# Patient Record
Sex: Female | Born: 1970 | Race: Black or African American | Hispanic: No | Marital: Single | Smoking: Never smoker
Health system: Southern US, Community
[De-identification: ages and names within clinical notes are randomized; demographics above are authoritative.]

## PROBLEM LIST (undated history)

## (undated) DIAGNOSIS — F419 Anxiety disorder, unspecified: Secondary | ICD-10-CM

## (undated) DIAGNOSIS — A6000 Herpesviral infection of urogenital system, unspecified: Secondary | ICD-10-CM

## (undated) DIAGNOSIS — F32A Depression, unspecified: Secondary | ICD-10-CM

## (undated) DIAGNOSIS — G43909 Migraine, unspecified, not intractable, without status migrainosus: Secondary | ICD-10-CM

## (undated) DIAGNOSIS — T7840XA Allergy, unspecified, initial encounter: Secondary | ICD-10-CM

## (undated) DIAGNOSIS — E785 Hyperlipidemia, unspecified: Secondary | ICD-10-CM

## (undated) DIAGNOSIS — F988 Other specified behavioral and emotional disorders with onset usually occurring in childhood and adolescence: Secondary | ICD-10-CM

## (undated) DIAGNOSIS — IMO0002 Reserved for concepts with insufficient information to code with codable children: Secondary | ICD-10-CM

## (undated) DIAGNOSIS — F329 Major depressive disorder, single episode, unspecified: Secondary | ICD-10-CM

## (undated) HISTORY — DX: Herpesviral infection of urogenital system, unspecified: A60.00

## (undated) HISTORY — PX: EYE SURGERY: SHX253

## (undated) HISTORY — DX: Allergy, unspecified, initial encounter: T78.40XA

## (undated) HISTORY — DX: Migraine, unspecified, not intractable, without status migrainosus: G43.909

## (undated) HISTORY — DX: Reserved for concepts with insufficient information to code with codable children: IMO0002

## (undated) HISTORY — DX: Hyperlipidemia, unspecified: E78.5

---

## 1997-05-01 ENCOUNTER — Encounter (HOSPITAL_COMMUNITY): Admission: RE | Admit: 1997-05-01 | Discharge: 1997-07-30 | Payer: Self-pay | Admitting: Gynecology

## 1997-10-23 ENCOUNTER — Emergency Department (HOSPITAL_COMMUNITY): Admission: EM | Admit: 1997-10-23 | Discharge: 1997-10-23 | Payer: Self-pay | Admitting: Emergency Medicine

## 1999-06-01 ENCOUNTER — Inpatient Hospital Stay (HOSPITAL_COMMUNITY): Admission: AD | Admit: 1999-06-01 | Discharge: 1999-06-01 | Payer: Self-pay | Admitting: Gynecology

## 1999-06-01 ENCOUNTER — Encounter: Payer: Self-pay | Admitting: Gynecology

## 1999-12-05 ENCOUNTER — Inpatient Hospital Stay (HOSPITAL_COMMUNITY): Admission: AD | Admit: 1999-12-05 | Discharge: 1999-12-05 | Payer: Self-pay | Admitting: *Deleted

## 1999-12-05 ENCOUNTER — Encounter: Payer: Self-pay | Admitting: *Deleted

## 1999-12-16 ENCOUNTER — Encounter (INDEPENDENT_AMBULATORY_CARE_PROVIDER_SITE_OTHER): Payer: Self-pay | Admitting: Specialist

## 1999-12-16 ENCOUNTER — Ambulatory Visit (HOSPITAL_COMMUNITY): Admission: RE | Admit: 1999-12-16 | Discharge: 1999-12-16 | Payer: Self-pay | Admitting: *Deleted

## 1999-12-16 ENCOUNTER — Encounter: Payer: Self-pay | Admitting: *Deleted

## 1999-12-16 ENCOUNTER — Inpatient Hospital Stay (HOSPITAL_COMMUNITY): Admission: AD | Admit: 1999-12-16 | Discharge: 1999-12-19 | Payer: Self-pay | Admitting: Gynecology

## 1999-12-20 ENCOUNTER — Encounter: Admission: RE | Admit: 1999-12-20 | Discharge: 2000-03-19 | Payer: Self-pay | Admitting: Gynecology

## 2000-03-20 ENCOUNTER — Encounter: Admission: RE | Admit: 2000-03-20 | Discharge: 2000-06-18 | Payer: Self-pay | Admitting: Gynecology

## 2000-07-19 ENCOUNTER — Encounter: Admission: RE | Admit: 2000-07-19 | Discharge: 2000-08-18 | Payer: Self-pay | Admitting: Gynecology

## 2000-09-18 ENCOUNTER — Encounter: Admission: RE | Admit: 2000-09-18 | Discharge: 2000-10-18 | Payer: Self-pay | Admitting: Gynecology

## 2001-12-31 ENCOUNTER — Other Ambulatory Visit: Admission: RE | Admit: 2001-12-31 | Discharge: 2001-12-31 | Payer: Self-pay | Admitting: Gynecology

## 2003-03-08 ENCOUNTER — Other Ambulatory Visit: Admission: RE | Admit: 2003-03-08 | Discharge: 2003-03-08 | Payer: Self-pay | Admitting: Gynecology

## 2005-03-26 ENCOUNTER — Other Ambulatory Visit: Admission: RE | Admit: 2005-03-26 | Discharge: 2005-03-26 | Payer: Self-pay | Admitting: Gynecology

## 2005-07-31 ENCOUNTER — Emergency Department (HOSPITAL_COMMUNITY): Admission: EM | Admit: 2005-07-31 | Discharge: 2005-07-31 | Payer: Self-pay | Admitting: Emergency Medicine

## 2005-08-15 ENCOUNTER — Emergency Department (HOSPITAL_COMMUNITY): Admission: EM | Admit: 2005-08-15 | Discharge: 2005-08-15 | Payer: Self-pay | Admitting: Emergency Medicine

## 2006-03-31 DIAGNOSIS — R87619 Unspecified abnormal cytological findings in specimens from cervix uteri: Secondary | ICD-10-CM

## 2006-03-31 DIAGNOSIS — IMO0002 Reserved for concepts with insufficient information to code with codable children: Secondary | ICD-10-CM

## 2006-03-31 HISTORY — DX: Unspecified abnormal cytological findings in specimens from cervix uteri: R87.619

## 2006-03-31 HISTORY — DX: Reserved for concepts with insufficient information to code with codable children: IMO0002

## 2007-11-26 ENCOUNTER — Emergency Department (HOSPITAL_BASED_OUTPATIENT_CLINIC_OR_DEPARTMENT_OTHER): Admission: EM | Admit: 2007-11-26 | Discharge: 2007-11-26 | Payer: Self-pay | Admitting: Emergency Medicine

## 2008-04-05 ENCOUNTER — Emergency Department (HOSPITAL_BASED_OUTPATIENT_CLINIC_OR_DEPARTMENT_OTHER): Admission: EM | Admit: 2008-04-05 | Discharge: 2008-04-06 | Payer: Self-pay | Admitting: Emergency Medicine

## 2008-04-06 ENCOUNTER — Ambulatory Visit: Payer: Self-pay | Admitting: Diagnostic Radiology

## 2008-04-13 ENCOUNTER — Emergency Department (HOSPITAL_BASED_OUTPATIENT_CLINIC_OR_DEPARTMENT_OTHER): Admission: EM | Admit: 2008-04-13 | Discharge: 2008-04-13 | Payer: Self-pay | Admitting: Emergency Medicine

## 2008-04-13 ENCOUNTER — Ambulatory Visit: Payer: Self-pay | Admitting: Diagnostic Radiology

## 2009-02-25 ENCOUNTER — Emergency Department (HOSPITAL_BASED_OUTPATIENT_CLINIC_OR_DEPARTMENT_OTHER): Admission: EM | Admit: 2009-02-25 | Discharge: 2009-02-25 | Payer: Self-pay | Admitting: Emergency Medicine

## 2009-08-16 ENCOUNTER — Ambulatory Visit: Payer: Self-pay | Admitting: Family Medicine

## 2009-08-16 DIAGNOSIS — J45909 Unspecified asthma, uncomplicated: Secondary | ICD-10-CM

## 2009-08-16 LAB — CONVERTED CEMR LAB: Beta hcg, urine, semiquantitative: POSITIVE

## 2009-08-24 ENCOUNTER — Inpatient Hospital Stay (HOSPITAL_COMMUNITY): Admission: AD | Admit: 2009-08-24 | Discharge: 2009-08-24 | Payer: Self-pay | Admitting: Obstetrics & Gynecology

## 2009-08-24 ENCOUNTER — Ambulatory Visit: Payer: Self-pay | Admitting: Advanced Practice Midwife

## 2009-08-30 ENCOUNTER — Ambulatory Visit: Payer: Self-pay | Admitting: Obstetrics & Gynecology

## 2009-08-30 ENCOUNTER — Encounter: Payer: Self-pay | Admitting: Physician Assistant

## 2009-08-30 LAB — CONVERTED CEMR LAB
Antibody Screen: NEGATIVE
Basophils Absolute: 0 10*3/uL (ref 0.0–0.1)
Basophils Relative: 0 % (ref 0–1)
Eosinophils Absolute: 0.3 10*3/uL (ref 0.0–0.7)
Eosinophils Relative: 3 % (ref 0–5)
HCT: 38.8 % (ref 36.0–46.0)
Hemoglobin: 12.4 g/dL (ref 12.0–15.0)
Hepatitis B Surface Ag: NEGATIVE
Hgb A2 Quant: 2.5 % (ref 2.2–3.2)
Hgb A: 97.5 % (ref 96.8–97.8)
Hgb F Quant: 0 % (ref 0.0–2.0)
Hgb S Quant: 0 % (ref 0.0–0.0)
Lymphocytes Relative: 23 % (ref 12–46)
Lymphs Abs: 2.2 10*3/uL (ref 0.7–4.0)
MCHC: 32 g/dL (ref 30.0–36.0)
MCV: 87.8 fL (ref 78.0–100.0)
Monocytes Absolute: 0.7 10*3/uL (ref 0.1–1.0)
Monocytes Relative: 7 % (ref 3–12)
Neutro Abs: 6.4 10*3/uL (ref 1.7–7.7)
Neutrophils Relative %: 66 % (ref 43–77)
Platelets: 315 10*3/uL (ref 150–400)
RBC: 4.42 M/uL (ref 3.87–5.11)
RDW: 13.6 % (ref 11.5–15.5)
Rh Type: POSITIVE
Rubella: 33 intl units/mL — ABNORMAL HIGH
WBC: 9.7 10*3/uL (ref 4.0–10.5)

## 2009-08-31 ENCOUNTER — Encounter: Payer: Self-pay | Admitting: Physician Assistant

## 2009-09-14 ENCOUNTER — Ambulatory Visit: Payer: Self-pay | Admitting: Family

## 2009-09-14 ENCOUNTER — Other Ambulatory Visit: Admission: RE | Admit: 2009-09-14 | Discharge: 2009-09-14 | Payer: Self-pay | Admitting: Obstetrics & Gynecology

## 2009-09-22 ENCOUNTER — Inpatient Hospital Stay (HOSPITAL_COMMUNITY): Admission: AD | Admit: 2009-09-22 | Discharge: 2009-09-22 | Payer: Self-pay | Admitting: Obstetrics & Gynecology

## 2009-09-22 ENCOUNTER — Ambulatory Visit: Payer: Self-pay | Admitting: Nurse Practitioner

## 2009-09-30 ENCOUNTER — Ambulatory Visit: Payer: Self-pay | Admitting: Nurse Practitioner

## 2009-09-30 ENCOUNTER — Inpatient Hospital Stay (HOSPITAL_COMMUNITY): Admission: AD | Admit: 2009-09-30 | Discharge: 2009-09-30 | Payer: Self-pay | Admitting: Family Medicine

## 2009-10-02 ENCOUNTER — Ambulatory Visit (HOSPITAL_COMMUNITY): Admission: RE | Admit: 2009-10-02 | Discharge: 2009-10-02 | Payer: Self-pay | Admitting: Obstetrics & Gynecology

## 2009-10-24 ENCOUNTER — Ambulatory Visit (HOSPITAL_COMMUNITY): Admission: RE | Admit: 2009-10-24 | Discharge: 2009-10-24 | Payer: Self-pay | Admitting: Obstetrics & Gynecology

## 2009-10-24 ENCOUNTER — Ambulatory Visit: Payer: Self-pay | Admitting: Obstetrics & Gynecology

## 2009-11-13 ENCOUNTER — Ambulatory Visit (HOSPITAL_COMMUNITY): Admission: RE | Admit: 2009-11-13 | Discharge: 2009-11-13 | Payer: Self-pay | Admitting: Obstetrics & Gynecology

## 2009-11-14 ENCOUNTER — Ambulatory Visit: Payer: Self-pay | Admitting: Obstetrics & Gynecology

## 2009-12-14 ENCOUNTER — Ambulatory Visit: Payer: Self-pay | Admitting: Physician Assistant

## 2009-12-27 ENCOUNTER — Inpatient Hospital Stay (HOSPITAL_COMMUNITY): Admission: AD | Admit: 2009-12-27 | Discharge: 2009-12-27 | Payer: Self-pay | Admitting: Obstetrics & Gynecology

## 2010-01-18 ENCOUNTER — Ambulatory Visit: Payer: Self-pay | Admitting: Advanced Practice Midwife

## 2010-01-18 ENCOUNTER — Encounter: Payer: Self-pay | Admitting: Family

## 2010-01-18 LAB — CONVERTED CEMR LAB
HCT: 31.5 % — ABNORMAL LOW (ref 36.0–46.0)
HIV: NONREACTIVE
Hemoglobin: 10.4 g/dL — ABNORMAL LOW (ref 12.0–15.0)
MCHC: 33 g/dL (ref 30.0–36.0)
MCV: 87 fL (ref 78.0–100.0)
Platelets: 262 10*3/uL (ref 150–400)
RBC: 3.62 M/uL — ABNORMAL LOW (ref 3.87–5.11)
RDW: 13.8 % (ref 11.5–15.5)
WBC: 8.3 10*3/uL (ref 4.0–10.5)

## 2010-02-13 ENCOUNTER — Ambulatory Visit: Payer: Self-pay | Admitting: Obstetrics & Gynecology

## 2010-03-06 ENCOUNTER — Ambulatory Visit: Payer: Self-pay | Admitting: Obstetrics & Gynecology

## 2010-03-15 ENCOUNTER — Ambulatory Visit (HOSPITAL_COMMUNITY)
Admission: RE | Admit: 2010-03-15 | Discharge: 2010-03-15 | Payer: Self-pay | Source: Home / Self Care | Attending: Obstetrics & Gynecology | Admitting: Obstetrics & Gynecology

## 2010-03-15 ENCOUNTER — Encounter: Payer: Self-pay | Admitting: Family

## 2010-03-15 ENCOUNTER — Ambulatory Visit: Payer: Self-pay | Admitting: Family

## 2010-03-15 LAB — CONVERTED CEMR LAB

## 2010-03-16 ENCOUNTER — Encounter: Payer: Self-pay | Admitting: Family

## 2010-03-24 ENCOUNTER — Inpatient Hospital Stay (HOSPITAL_COMMUNITY)
Admission: AD | Admit: 2010-03-24 | Discharge: 2010-03-24 | Payer: Self-pay | Source: Home / Self Care | Attending: Obstetrics and Gynecology | Admitting: Obstetrics and Gynecology

## 2010-03-26 ENCOUNTER — Ambulatory Visit: Payer: Self-pay | Admitting: Obstetrics & Gynecology

## 2010-04-05 ENCOUNTER — Ambulatory Visit
Admission: RE | Admit: 2010-04-05 | Discharge: 2010-04-05 | Payer: Self-pay | Source: Home / Self Care | Attending: Physician Assistant | Admitting: Physician Assistant

## 2010-04-06 ENCOUNTER — Inpatient Hospital Stay (HOSPITAL_COMMUNITY)
Admission: AD | Admit: 2010-04-06 | Discharge: 2010-04-08 | Payer: Self-pay | Source: Home / Self Care | Attending: Obstetrics and Gynecology | Admitting: Obstetrics and Gynecology

## 2010-04-06 HISTORY — PX: TUBAL LIGATION: SHX77

## 2010-04-15 LAB — CBC
HCT: 32.6 % — ABNORMAL LOW (ref 36.0–46.0)
HCT: 27 % — ABNORMAL LOW (ref 36.0–46.0)
Hemoglobin: 10.5 g/dL — ABNORMAL LOW (ref 12.0–15.0)
Hemoglobin: 8.7 g/dL — ABNORMAL LOW (ref 12.0–15.0)
MCH: 26.4 pg (ref 26.0–34.0)
MCH: 26.2 pg (ref 26.0–34.0)
MCHC: 32.2 g/dL (ref 30.0–36.0)
MCHC: 32.2 g/dL (ref 30.0–36.0)
MCV: 81.9 fL (ref 78.0–100.0)
MCV: 81.3 fL (ref 78.0–100.0)
Platelets: 234 10*3/uL (ref 150–400)
Platelets: 202 10*3/uL (ref 150–400)
RBC: 3.98 MIL/uL (ref 3.87–5.11)
RBC: 3.32 MIL/uL — ABNORMAL LOW (ref 3.87–5.11)
RDW: 14.1 % (ref 11.5–15.5)
RDW: 14.2 % (ref 11.5–15.5)
WBC: 8.9 10*3/uL (ref 4.0–10.5)
WBC: 10.5 10*3/uL (ref 4.0–10.5)

## 2010-04-15 LAB — RPR: RPR Ser Ql: NONREACTIVE

## 2010-04-15 LAB — ABO/RH: ABO/RH(D): O POS

## 2010-04-21 ENCOUNTER — Encounter: Payer: Self-pay | Admitting: Obstetrics & Gynecology

## 2010-04-21 ENCOUNTER — Encounter: Payer: Self-pay | Admitting: Family Medicine

## 2010-05-02 NOTE — Assessment & Plan Note (Signed)
Summary: BAYSE/VOMITING/PREGANT?   Vital Signs:  Patient Profile:   40 Years Old Female CC:      N/V X 2 weeks, fatigue, weight loss LMP:     07/08/2009 Height:     67 inches Weight:      188 pounds O2 Sat:      100 % O2 treatment:    Room Air Temp:     97.3 degrees F oral Pulse rate:   108 / minute Pulse rhythm:   regular Resp:     16 per minute BP sitting:   121 / 79  (right arm) Cuff size:   regular  Pt. in pain?   no  Vitals Entered By: Lajean Saver RN (Aug 16, 2009 2:04 PM)  Menstrual History: LMP (date): 07/08/2009                   Updated Prior Medication List: VENTOLIN HFA 108 (90 BASE) MCG/ACT AERS (ALBUTEROL SULFATE) prn ZYRTEC ALLERGY 10 MG TABS (CETIRIZINE HCL) once daily  Current Allergies: ! SULFAHistory of Present Illness Chief Complaint: N/V X 2 weeks, fatigue, weight loss History of Present Illness: AS ABOVE. THINKS SHE MAY BE PREGNANT. LMP APRIL 10. IRREG. HAS BREAT ENLARGMENT AND TENDERENESS. INCREASED URINATION BUT NO DYSURIA. NO VAG DISCHARGE OR BLEEDING. IS G2P2  REVIEW OF SYSTEMS Constitutional Symptoms       Complains of fatigue.     Denies fever, chills, night sweats, weight loss, and weight gain.      Comments: weight loss Eyes       Denies change in vision, eye pain, eye discharge, glasses, contact lenses, and eye surgery. Ear/Nose/Throat/Mouth       Denies hearing loss/aids, change in hearing, ear pain, ear discharge, dizziness, frequent runny nose, frequent nose bleeds, sinus problems, sore throat, hoarseness, and tooth pain or bleeding.  Respiratory       Denies dry cough, productive cough, wheezing, shortness of breath, asthma, bronchitis, and emphysema/COPD.  Cardiovascular       Denies murmurs, chest pain, and tires easily with exhertion.    Gastrointestinal       Complains of nausea/vomiting.      Denies stomach pain, diarrhea, constipation, blood in bowel movements, and indigestion. Genitourniary       Denies painful  urination, kidney stones, and loss of urinary control. Neurological       Denies paralysis, seizures, and fainting/blackouts. Musculoskeletal       Denies muscle pain, joint pain, joint stiffness, decreased range of motion, redness, swelling, muscle weakness, and gout.  Skin       Denies bruising, unusual mles/lumps or sores, and hair/skin or nail changes.  Psych       Denies mood changes, temper/anger issues, anxiety/stress, speech problems, depression, and sleep problems. Other Comments: symptoms X 2 weeks, patient believes she may be pregnant, no c/o abdominal pain   Past History:  Past Medical History: Asthma  Past Surgical History: Denies surgical history  Family History: none  Social History: Never Smoked Alcohol use-no Drug use-no has two childrenSmoking Status:  never Drug Use:  no Physical Exam General appearance: well developed, well nourished, no acute distress Nasal: mucosa pink, nonedematous, no septal deviation, turbinates normal Oral/Pharynx: tongue normal, posterior pharynx without erythema or exudate Neck: neck supple,  trachea midline, no masses Chest/Lungs: no rales, wheezes, or rhonchi bilateral, breath sounds equal without effort Heart: regular rate and  rhythm, no murmur Abdomen: soft, non-tender without obvious organomegaly Extremities: normal extremities Assessment New Problems:  NAUSEA AND VOMITING (ICD-787.01) PREGNANCY (ICD-V22.2) ASTHMA (ICD-493.90)   Plan New Medications/Changes: PROMETHAZINE HCL 25 MG TABS (PROMETHAZINE HCL) 1 by mouth Q 6-8 HRS as needed N/V  ##20 x 0, 08/16/2009, Marvis Moeller DO  New Orders: New Patient Level III [99203]   Prescriptions: PROMETHAZINE HCL 25 MG TABS (PROMETHAZINE HCL) 1 by mouth Q 6-8 HRS as needed N/V  ##20 x 0   Entered and Authorized by:   Marvis Moeller DO   Signed by:   Marvis Moeller DO on 08/16/2009   Method used:   Print then Give to Patient   RxID:   [id number]   Patient  Instructions: 1)  PRENATAL VIT. FOLLOW UP WITH OB. YOUR ARE APPRX 5 WK AND 5 DAYS BY DATES WITH EDC OF JAN 17. TUMS RECOMMENDED AS WELL.  Laboratory Results   Urine Tests  Date/Time Received: Aug 16, 2009 2:17 PM  Date/Time Reported: Aug 16, 2009 2:17 PM     Urine HCG: positive

## 2010-05-17 ENCOUNTER — Ambulatory Visit: Payer: Medicaid Other

## 2010-06-10 LAB — URINE MICROSCOPIC-ADD ON

## 2010-06-10 LAB — URINALYSIS, ROUTINE W REFLEX MICROSCOPIC
Bilirubin Urine: NEGATIVE
Hgb urine dipstick: NEGATIVE
Ketones, ur: 15 mg/dL — AB
Nitrite: NEGATIVE
pH: 7 (ref 5.0–8.0)

## 2010-06-13 LAB — URINE MICROSCOPIC-ADD ON

## 2010-06-13 LAB — URINALYSIS, ROUTINE W REFLEX MICROSCOPIC
Hgb urine dipstick: NEGATIVE
Ketones, ur: 40 mg/dL — AB
Specific Gravity, Urine: >1.03 — ABNORMAL HIGH (ref 1.005–1.030)

## 2010-06-17 LAB — COMPREHENSIVE METABOLIC PANEL
AST: 14 U/L (ref 0–37)
CO2: 24 mEq/L (ref 19–32)
Calcium: 8.9 mg/dL (ref 8.4–10.5)
Creatinine, Ser: 0.75 mg/dL (ref 0.4–1.2)
GFR calc Af Amer: >60 mL/min (ref >60)
GFR calc non Af Amer: >60 mL/min (ref >60)
Glucose, Bld: 79 mg/dL (ref 70–99)

## 2010-06-17 LAB — HCG, QUANTITATIVE, PREGNANCY: hCG, Beta Chain, Quant, S: 19746 m[IU]/mL — ABNORMAL HIGH (ref <5)

## 2010-06-17 LAB — WET PREP, GENITAL: Yeast Wet Prep HPF POC: NONE SEEN

## 2010-06-17 LAB — URINALYSIS, ROUTINE W REFLEX MICROSCOPIC
Ketones, ur: NEGATIVE mg/dL
Nitrite: NEGATIVE
Nitrite: NEGATIVE
Specific Gravity, Urine: 1.02 (ref 1.005–1.030)
Specific Gravity, Urine: 1.025 (ref 1.005–1.030)
pH: 5.5 (ref 5.0–8.0)
pH: 6 (ref 5.0–8.0)

## 2010-06-17 LAB — URINE MICROSCOPIC-ADD ON

## 2010-06-17 LAB — CBC
Hemoglobin: 12.7 g/dL (ref 12.0–15.0)
MCH: 29.6 pg (ref 26.0–34.0)
MCHC: 33.6 g/dL (ref 30.0–36.0)
RDW: 13.3 % (ref 11.5–15.5)

## 2010-06-21 NOTE — Assessment & Plan Note (Signed)
NAME:  Anita Osborne, Anita Osborne             ACCOUNT NO.:  [id number]  MEDICAL RECORD NO.:  [id number]           PATIENT TYPE:  LOCATION:  [facility] [place]           FACILITY:  PHYSICIAN:  Sid Falcon, CNM  DATE OF BIRTH:  1970-11-13  DATE OF SERVICE:  05/17/2010                                 CLINIC NOTE  REASON FOR VISIT:  A 6-week postpartum exam.  The patient is here after a normal spontaneous vaginal delivery on April 06, 2010, which was followed by a postop bilateral tubal ligation on the following day, here with no reports of bleeding x2 weeks, breast- feeding well, exclusively.  Plans to resume work on May 20, 2010.  No reports of sexual intercourse since delivery and the patient is fine with that.  No other problems or concerns.  Denies per postpartum depression or issues with that.  Last Pap smear was in June 2011.  PHYSICAL EXAMINATION:  VITAL SIGNS:  Stable.  Temperature 97.2, pulse 70, blood pressure 116/79, weight 159. GENERAL:  Alert and oriented x3.  No signs of acute distress. CARDIOVASCULAR:  Regular rate and rhythm without murmurs, gallops, or rubs. LUNGS:  Clear to auscultation bilaterally. BREASTS:  Soft and nontender.  No dominant masses. ABDOMEN:  Soft and nontender.  Positive bowel sounds x4. PELVIC:  Intact.  No abnormal discharge noted.  No abnormal lesions. Cervix:  No abnormal discharge.  Uterus:  Mobile, midline involuted, nontender with palpation.  ASSESSMENT:  Normal postpartum exam.  PLAN:  The patient discussed that she will get her Pap smear in June with well-woman exam and will follow up sooner if need.     Sid Falcon, CNM    WM/MEDQ  D:  05/17/2010  T:  05/18/2010  Job:  [id number]

## 2010-07-15 LAB — URINALYSIS, ROUTINE W REFLEX MICROSCOPIC
Hgb urine dipstick: NEGATIVE
Nitrite: NEGATIVE
Specific Gravity, Urine: 1.026 (ref 1.005–1.030)
Urobilinogen, UA: 0.2 mg/dL (ref 0.0–1.0)

## 2010-07-15 LAB — BASIC METABOLIC PANEL
Calcium: 9.3 mg/dL (ref 8.4–10.5)
GFR calc Af Amer: >60 mL/min (ref >60)
GFR calc non Af Amer: >60 mL/min (ref >60)
Glucose, Bld: 86 mg/dL (ref 70–99)
Sodium: 140 mEq/L (ref 135–145)

## 2010-07-15 LAB — COMPREHENSIVE METABOLIC PANEL
ALT: 51 U/L — ABNORMAL HIGH (ref 0–35)
AST: 47 U/L — ABNORMAL HIGH (ref 0–37)
Alkaline Phosphatase: 67 U/L (ref 39–117)
CO2: 28 mEq/L (ref 19–32)
Chloride: 105 mEq/L (ref 96–112)
GFR calc Af Amer: >60 mL/min (ref >60)
GFR calc non Af Amer: >60 mL/min (ref >60)
Glucose, Bld: 82 mg/dL (ref 70–99)
Potassium: 3.9 mEq/L (ref 3.5–5.1)
Sodium: 140 mEq/L (ref 135–145)

## 2010-07-15 LAB — URINE MICROSCOPIC-ADD ON

## 2010-07-15 LAB — POCT TOXICOLOGY PANEL
Benzodiazepines: POSITIVE
Opiates: POSITIVE

## 2010-07-15 LAB — CBC
Hemoglobin: 12.9 g/dL (ref 12.0–15.0)
RBC: 4.49 MIL/uL (ref 3.87–5.11)
RDW: 12.9 % (ref 11.5–15.5)

## 2010-07-15 LAB — DIFFERENTIAL
Basophils Absolute: 0 10*3/uL (ref 0.0–0.1)
Lymphocytes Relative: 31 % (ref 12–46)
Monocytes Absolute: 0.3 10*3/uL (ref 0.1–1.0)
Neutro Abs: 4 10*3/uL (ref 1.7–7.7)

## 2010-08-16 NOTE — Discharge Summary (Signed)
[facility]  Patient:    Anita Osborne, Anita Osborne             MRN: [id number] Adm. Date:  [id number] Disc. Date: [id number] Attending:  Tonye Royalty Dictator:   Antony Contras, [facility]                           Discharge Summary  DISCHARGE DIAGNOSES:          1. Intrauterine pregnancy at 37 weeks.                               2. History of severe sciatica.                               3. Oligohydramnios.  PROCEDURES:                   1. Amniocentesis.                               2. Induction of labor.                               3. Normal spontaneous delivery of viable infant.  HISTORY OF PRESENT ILLNESS:   The patient is a 40 year old, gravida 2, para 1-0-0-1, with a EDC of January 04, 2000, per ultrasound.  Prenatal risk factors include a history of gestational diabetes in a previous pregnancy, also history of BV in her first trimester.  She also had severe sciatica during this pregnancy which was virtually unresponsive to treatment.  PRENATAL LABORATORY DATA:     Blood type O positive, antibody screen negative, sickle cell negative, RPR, HBsAg, and HIV nonreactive, rubella immune, MS-AFP normal, GBS is negative.  HOSPITAL COURSE:              The patient was admitted at 37 weeks for amniocentesis to be followed by induction of labor if lung maturity was demonstrated.  At the time of the amniocentesis she was noted to have an AFI of  4.8 cm.  A BPP at that time was 8/10.  It was then decided to plan her induction secondary to oligohydramnios.  Her cervix was 1 cm, 50%, -2.  Labor was induced  initially with Cervidil followed by Pitocin.  She did progress to complete dilatation and delivered an Apgars 8/9 female infant over a midline episiotomy.  Infants weight was 5 pounds 13 pounces.  Postpartum course:  She remained afebrile.  She had no difficulty voiding.  She did have some hemorrhoidal pain.  She was able to be  discharged on her second postpartum day in satisfactory condition.  LABORATORY AND X-RAY DATA:    CBC:  Hematocrit 29.8, hemoglobin 10.2, WBC 10.5,  platelets 176.  FOLLOWUP:                     Follow up in six weeks.  DISCHARGE MEDICATIONS:        Continue prenatal vitamins and iron, Motrin and Tylox for pain, Anusol suppositories for hemorrhoids. DD:  01/10/00 TD:  01/12/00 Job: [id number]

## 2010-10-03 IMAGING — US US OB DETAIL+14 WK
1 series · 18 of 28 positions shown · non-contrast
Comparison: none

OBSTETRICAL ULTRASOUND:
 This ultrasound was performed in The [HOSPITAL], and the [facility] report will be stored to [REDACTED] PACS.  This report is also available in [HOSPITAL]?s accessANYware.

[Series 1: us ob detail+14 wk · 18 of 107 slices shown]
[im 1/107]
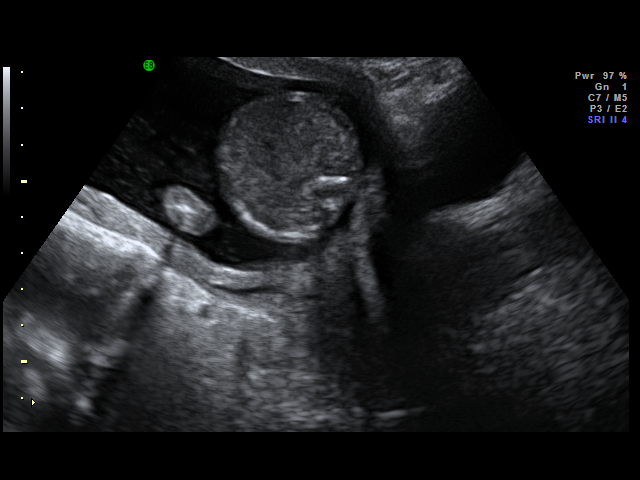
[im 8/107]
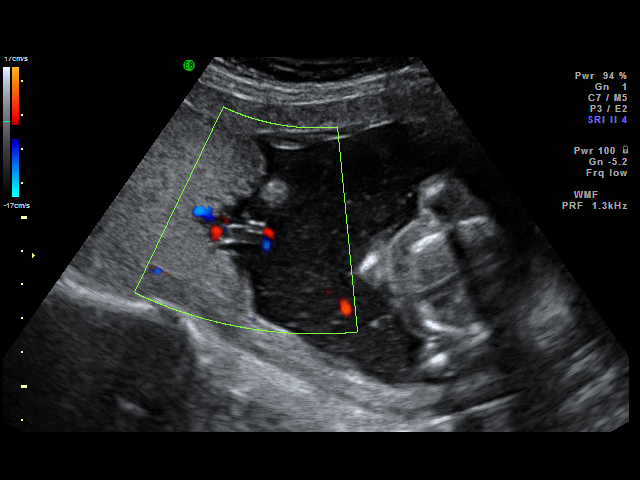
[im 12/107]
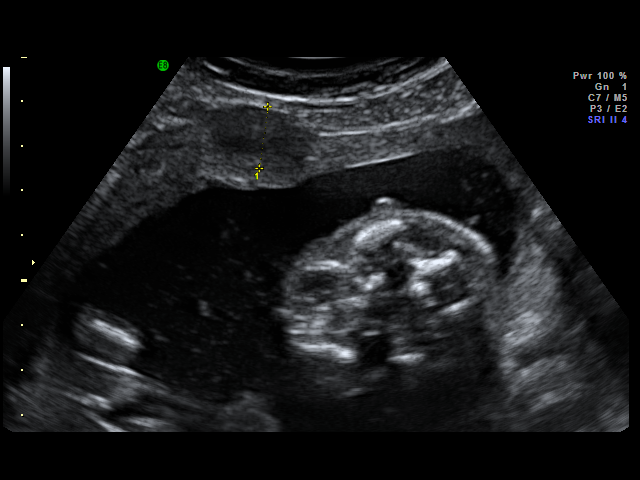
[im 20/107]
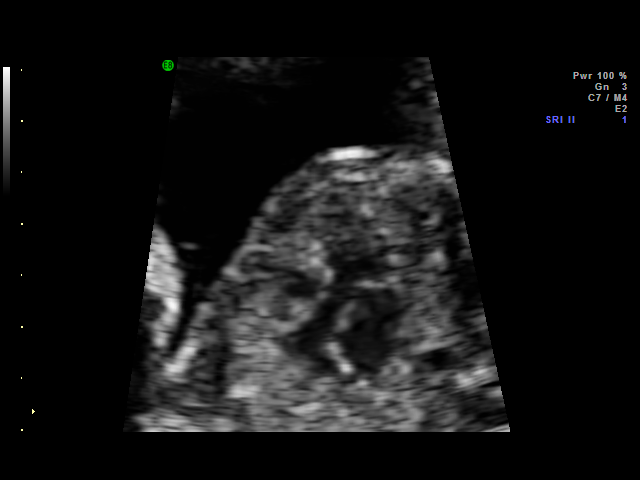
[im 28/107]
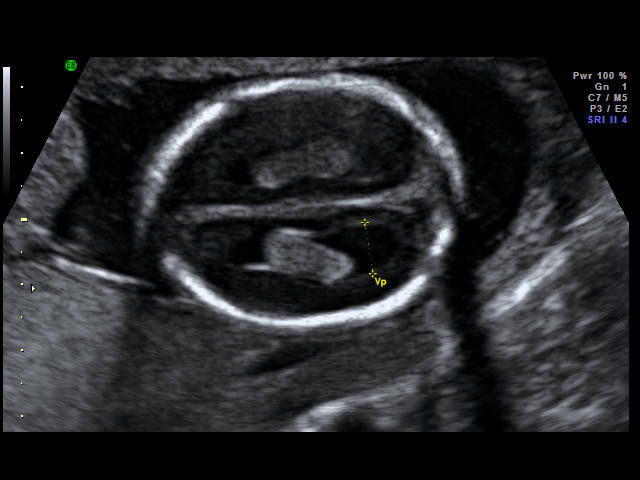
[im 32/107]
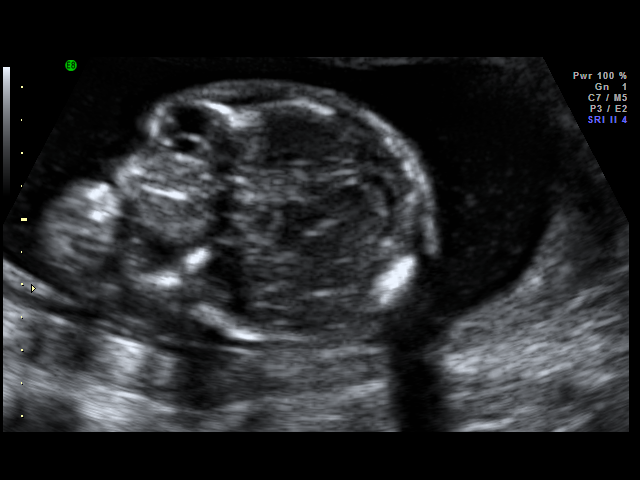
[im 40/107]
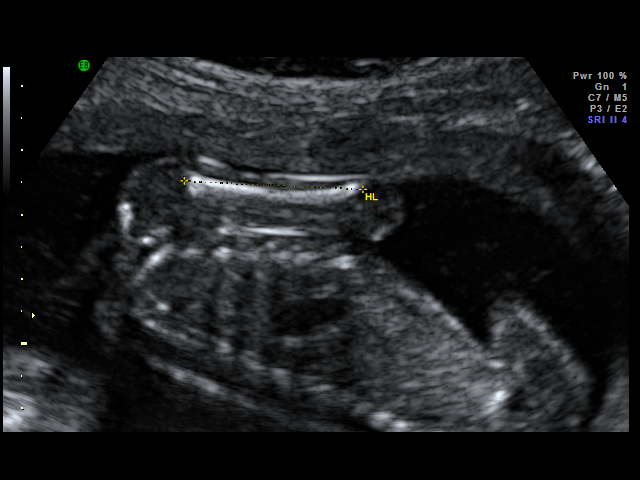
[im 44/107]
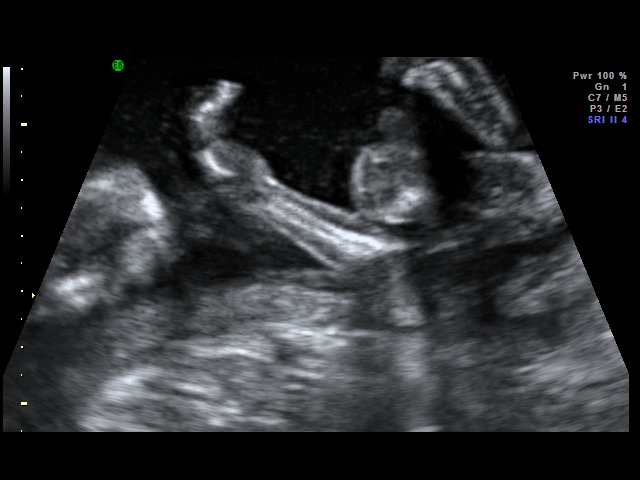
[im 52/107]
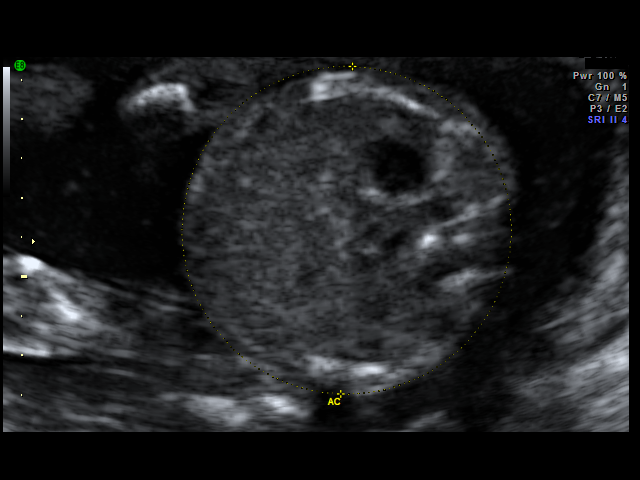
[im 55/107]
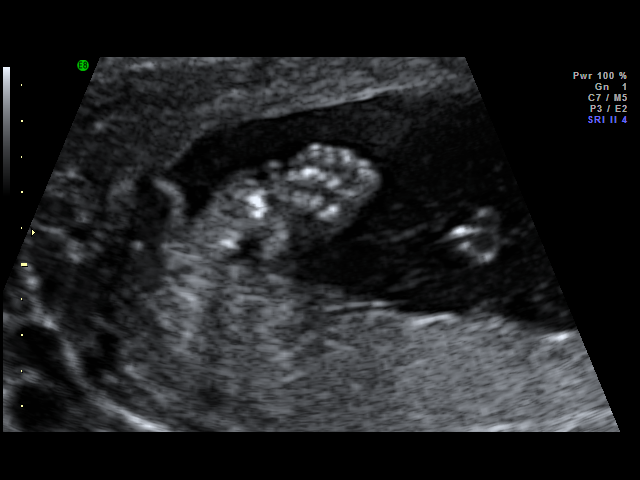
[im 63/107]
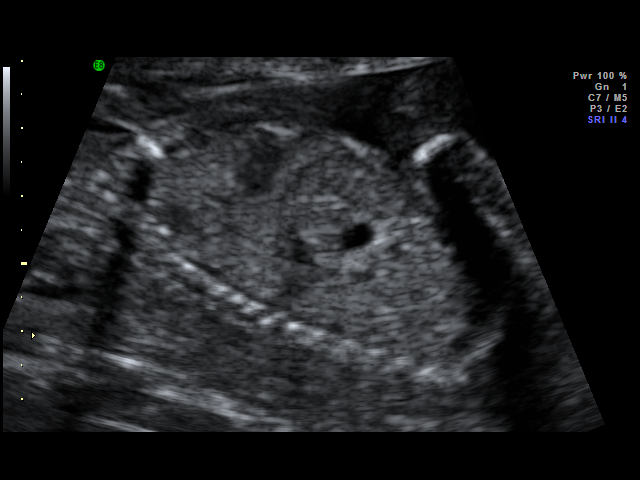
[im 67/107]
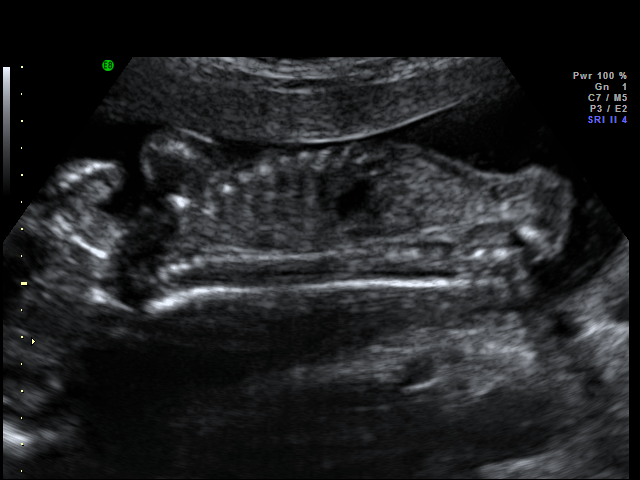
[im 75/107]
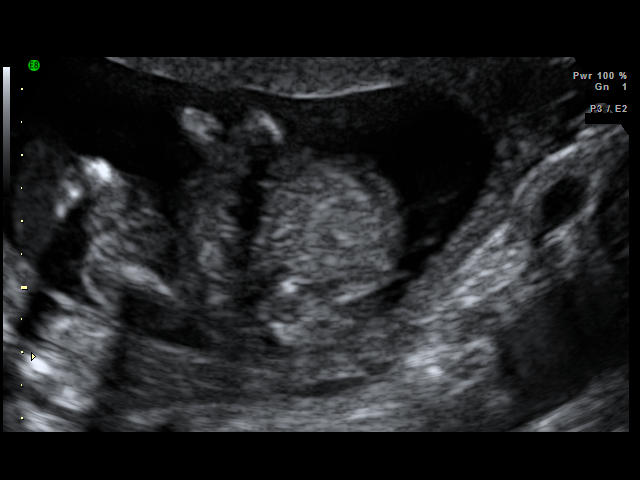
[im 83/107]
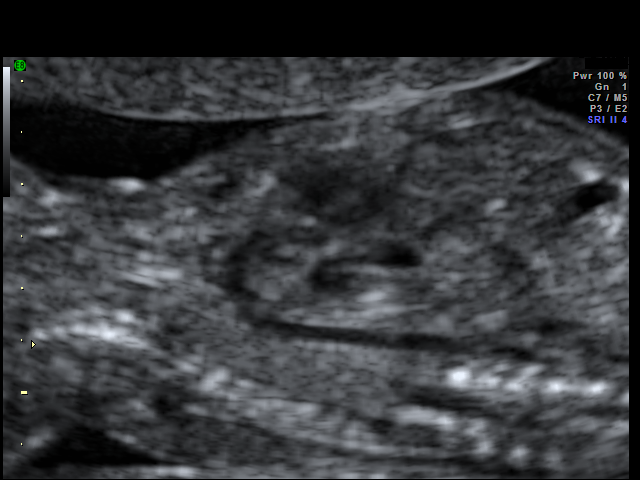
[im 87/107]
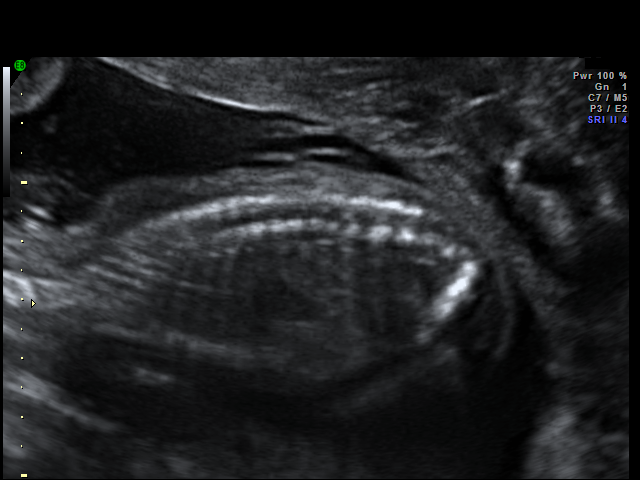
[im 95/107]
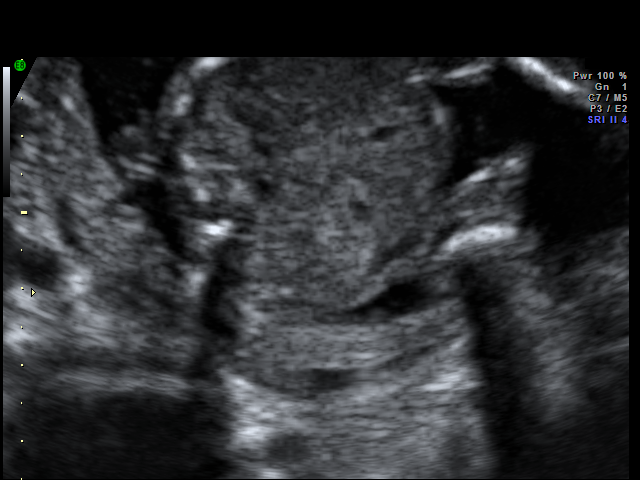
[im 99/107]
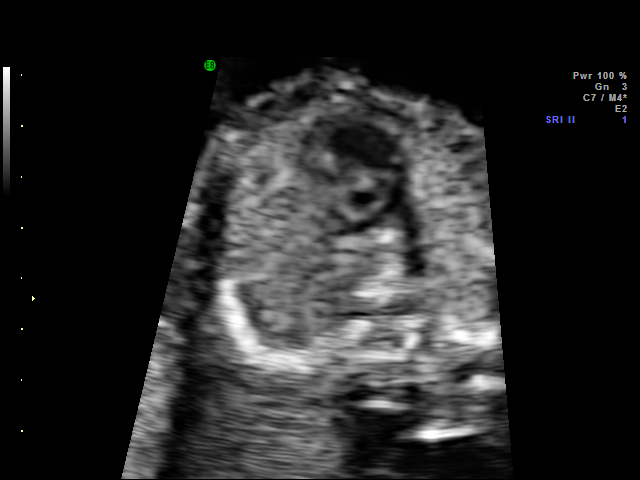
[im 107/107]
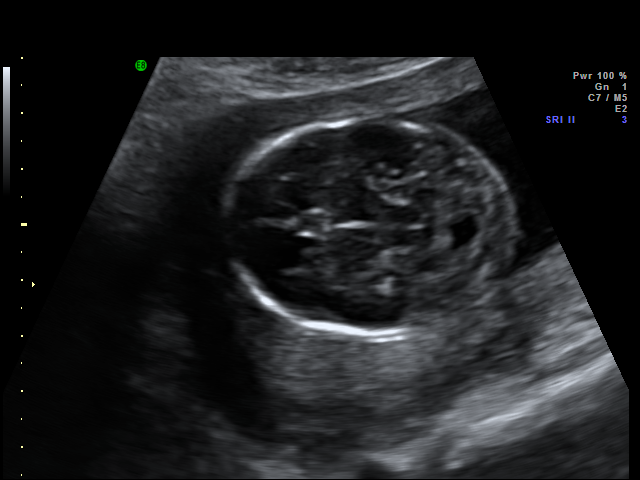

[18 of 28 positions shown; findings below may reference images not displayed]

IMPRESSION: [facility] has also been faxed to the ordering physician.

## 2010-10-30 ENCOUNTER — Encounter: Payer: Self-pay | Admitting: *Deleted

## 2010-10-30 ENCOUNTER — Emergency Department (HOSPITAL_BASED_OUTPATIENT_CLINIC_OR_DEPARTMENT_OTHER)
Admission: EM | Admit: 2010-10-30 | Discharge: 2010-10-30 | Disposition: A | Discharge status: Home / Self Care | Payer: BC Managed Care – PPO | Attending: Emergency Medicine | Admitting: Emergency Medicine

## 2010-10-30 ENCOUNTER — Inpatient Hospital Stay
Admission: RE | Admit: 2010-10-30 | Payer: BC Managed Care – PPO | Source: Ambulatory Visit | Admitting: Emergency Medicine

## 2010-10-30 DIAGNOSIS — IMO0002 Reserved for concepts with insufficient information to code with codable children: Secondary | ICD-10-CM | POA: Insufficient documentation

## 2010-10-30 DIAGNOSIS — I889 Nonspecific lymphadenitis, unspecified: Secondary | ICD-10-CM | POA: Insufficient documentation

## 2010-10-30 MED ORDER — IBUPROFEN 800 MG PO TABS: 800.0000 mg | ORAL_TABLET | Freq: Three times a day (TID) | ORAL | Status: AC

## 2010-10-30 MED ORDER — CEPHALEXIN 500 MG PO CAPS: 500.0000 mg | ORAL_CAPSULE | Freq: Three times a day (TID) | ORAL | Status: AC

## 2010-10-30 NOTE — ED Notes (Signed)
Pt noticed lump to left antecubital area since this am. Sore at site. Seen at Urgent Care and sent here.

## 2010-10-30 NOTE — ED Provider Notes (Signed)
History     Chief Complaint  Patient presents with  . Abscess   HPI Comments: Patient states she woke up this morning with a small swollen area in the left antecubital fossa. This is constant, gradually increasing in size, and only tender when palpated. She has not injured her arm and has no other rash, swelling, pain, redness or fever. Symptoms are constant, mild. She has no other swollen areas in her body.  Patient is a 40 y.o. female presenting with abscess. The history is provided by the patient.  Abscess  Pertinent negatives include no fever.    Past Medical History  Diagnosis Date  . Asthma     History reviewed. No pertinent past surgical history.  No family history on file.  History  Substance Use Topics  . Smoking status: Never Smoker   . Smokeless tobacco: Not on file  . Alcohol Use: No    OB History    Grav Para Term Preterm Abortions TAB SAB Ect Mult Living                  Review of Systems  Constitutional: Negative for fever.  Hematological: Positive for adenopathy.    Physical Exam  BP 115/69  Pulse 80  Temp(Src) 98.5 F (36.9 C) (Oral)  Resp 16  Ht 5\' 6"  (1.676 m)  Wt 150 lb (68.04 kg)  BMI 24.21 kg/m2  LMP 10/28/2010  Physical Exam  Constitutional: She appears well-developed and well-nourished. No distress.  HENT:  Head: Normocephalic and atraumatic.  Eyes: Conjunctivae are normal. No scleral icterus.  Cardiovascular: Normal rate and regular rhythm.   Pulmonary/Chest: Effort normal and breath sounds normal.  Musculoskeletal: Normal range of motion. She exhibits no edema.       Lymphadenopathy to the left antecubital fossa with a single mobile rubbery mildly tender lymph node. No overlying redness or indurated skin.  Lymphadenopathy:       Head (right side): No submental, no submandibular, no preauricular and no posterior auricular adenopathy present.       Head (left side): No submental, no submandibular, no preauricular and no posterior  auricular adenopathy present.    She has no cervical adenopathy.    She has no axillary adenopathy.       Right: No epitrochlear adenopathy present.       Left: Epitrochlear adenopathy present.  Neurological: She is alert.  Skin: Skin is warm and dry. No rash noted. She is not diaphoretic.  Psychiatric: She has a normal mood and affect.    ED Course  Procedures  MDM Patient is well-appearing with normal vital signs and no signs of infection or inflammation to her left upper extremity other than an isolated lymph node which appears enlarged and mildly tender. I have performed a bedside ultrasound showing that there is no abscess and no involvement of her vasculature of the left upper extremity. She has normal pulses, normal sensation, normal capillary refill. Will treat for lymphadenitis and a followup with family doctor should symptoms continue. She is aware of the symptoms for return should her symptoms worsen.  Filed Vitals:   10/30/10 1138  BP: 115/69  Pulse: 80  Temp: 98.5 F (36.9 C)  Resp: 16         Vida Roller, MD 10/30/10 1200

## 2010-11-02 ENCOUNTER — Encounter: Payer: Self-pay | Admitting: *Deleted

## 2010-11-08 ENCOUNTER — Ambulatory Visit: Payer: BC Managed Care – PPO

## 2011-02-17 ENCOUNTER — Emergency Department
Admission: EM | Admit: 2011-02-17 | Discharge: 2011-02-17 | Disposition: A | Discharge status: Home / Self Care | Payer: BC Managed Care – PPO | Source: Home / Self Care | Attending: Emergency Medicine | Admitting: Emergency Medicine

## 2011-02-17 DIAGNOSIS — B9689 Other specified bacterial agents as the cause of diseases classified elsewhere: Secondary | ICD-10-CM

## 2011-02-17 DIAGNOSIS — N760 Acute vaginitis: Secondary | ICD-10-CM

## 2011-02-17 DIAGNOSIS — A499 Bacterial infection, unspecified: Secondary | ICD-10-CM

## 2011-02-17 MED ORDER — METRONIDAZOLE 500 MG PO TABS: 500.0000 mg | ORAL_TABLET | Freq: Two times a day (BID) | ORAL | Status: AC

## 2011-02-17 MED ORDER — FLUCONAZOLE 150 MG PO TABS: 150.0000 mg | ORAL_TABLET | Freq: Once | ORAL | Status: AC

## 2011-02-17 NOTE — ED Provider Notes (Signed)
History     CSN: [id number] Arrival date & time: 02/17/2011  6:50 PM   First MD Initiated Contact with Patient 02/17/11 1903      Chief Complaint  Patient presents with  . Vaginitis    (Consider location/radiation/quality/duration/timing/severity/associated sxs/prior treatment) HPI This 40 year old white female comes in today to be seen for a vaginal infection.  She describes it as a fishy odor that is somewhat itchy as as well.  She also describes it as a clear discharge.  She does have a history of bacterial vaginosis.  She essentially monogamous.  She has no history of any sexually transmitted diseases.  She states that it is an uncomfortable feeling and is requesting treatment.  She has no fever, chills, nausea, nor vomiting.  Past Medical History  Diagnosis Date  . Asthma   . Abnormal Pap smear 2008    Past Surgical History  Procedure Date  . Tubal ligation 04/06/10    Family History  Problem Relation Age of Onset  . Heart disease Maternal Grandmother   . Heart disease Paternal Grandmother   . Diabetes type I Maternal Grandmother   . Hypertension Maternal Grandmother     History  Substance Use Topics  . Smoking status: Never Smoker   . Smokeless tobacco: Never Used  . Alcohol Use: No    OB History    Grav Para Term Preterm Abortions TAB SAB Ect Mult Living   3 3 3       3       Review of Systems  Allergies  Sulfonamide derivatives; Codeine; and Percocet  Home Medications   Current Outpatient Rx  Name Route Sig Dispense Refill  . ALBUTEROL SULFATE HFA 108 (90 BASE) MCG/ACT IN AERS Inhalation Inhale 2 puffs into the lungs every 6 (six) hours as needed.      . ALPRAZOLAM 0.5 MG PO TABS Oral Take 0.5 mg by mouth at bedtime as needed.      Marland Kitchen CITALOPRAM HYDROBROMIDE 20 MG PO TABS Oral Take 20 mg by mouth daily.      Marland Kitchen FLUCONAZOLE 150 MG PO TABS Oral Take 1 tablet (150 mg total) by mouth once. May repeat in 3 days 2 tablet 0  . METRONIDAZOLE 500 MG PO TABS  Oral Take 1 tablet (500 mg total) by mouth 2 (two) times daily. 14 tablet 0    BP 149/85  Pulse 90  Temp(Src) 98.6 F (37 C) (Oral)  Resp 18  Ht 5\' 6"  (1.676 m)  Wt 160 lb 8 oz (72.802 kg)  BMI 25.91 kg/m2  SpO2 99%  LMP 01/29/2011  Physical Exam  Nursing note and vitals reviewed. Constitutional: She is oriented to person, place, and time. She appears well-developed and well-nourished.  HENT:  Head: Normocephalic and atraumatic.  Neck: Neck supple.  Cardiovascular: Regular rhythm and normal heart sounds.   Pulmonary/Chest: Effort normal and breath sounds normal. No respiratory distress.  Neurological: She is alert and oriented to person, place, and time.  Skin: Skin is warm and dry.  Psychiatric: She has a normal mood and affect. Her speech is normal.  GU exam deferred  ED Course  Procedures (including critical care time)  Labs Reviewed - No data to display No results found.   1. Bacterial vaginitis       MDM   This patient presents with bacterial vaginosis.  Differential diagnosis also includes yeast infections or sexual transmitted diseases.  We will treat with metronidazole and also treat with Diflucan as well.  I have given her any information sheet on the diagnosis.  If any further problems I have advised her to followup with her regular physician or an OB/GYN.     Lily Kocher, MD 02/17/11 [phone]

## 2011-06-06 ENCOUNTER — Emergency Department
Admission: EM | Admit: 2011-06-06 | Discharge: 2011-06-06 | Disposition: A | Discharge status: Home / Self Care | Payer: BC Managed Care – PPO | Source: Home / Self Care | Attending: Emergency Medicine | Admitting: Emergency Medicine

## 2011-06-06 DIAGNOSIS — R05 Cough: Secondary | ICD-10-CM

## 2011-06-06 DIAGNOSIS — J209 Acute bronchitis, unspecified: Secondary | ICD-10-CM

## 2011-06-06 MED ORDER — AZITHROMYCIN 250 MG PO TABS: ORAL_TABLET | ORAL | Status: AC

## 2011-06-06 MED ORDER — PREDNISONE (PAK) 10 MG PO TABS: 10.0000 mg | ORAL_TABLET | Freq: Every day | ORAL | Status: AC

## 2011-06-06 MED ORDER — BENZONATATE 200 MG PO CAPS: 200.0000 mg | ORAL_CAPSULE | Freq: Three times a day (TID) | ORAL | Status: AC | PRN

## 2011-06-06 NOTE — ED Notes (Signed)
Cough and congestion x 1 week 

## 2011-06-06 NOTE — ED Provider Notes (Signed)
History     CSN: [id number]  Arrival date & time 06/06/11  1324   First MD Initiated Contact with Patient 06/06/11 1933      Chief Complaint  Patient presents with  . Cough  . Nasal Congestion    (Consider location/radiation/quality/duration/timing/severity/associated sxs/prior treatment) HPI Anita Osborne is a 41 y.o. female who complains of onset of cold symptoms for 7 days. Her son had a similar illness, but he is getting better.  This is flaring up her asthma and she has been needing her inhaler, especially at night. No sore throat + cough No pleuritic pain + nasal congestion + post-nasal drainage + sinus pain/pressure + chest congestion No itchy/red eyes No earache No hemoptysis + SOB/wheezing No chills/sweats No fever No nausea No vomiting No abdominal pain No diarrhea No skin rashes + fatigue No myalgias No headache    Past Medical History  Diagnosis Date  . Asthma   . Abnormal Pap smear 2008    Past Surgical History  Procedure Date  . Tubal ligation 04/06/10    Family History  Problem Relation Age of Onset  . Heart disease Maternal Grandmother   . Heart disease Paternal Grandmother   . Diabetes type I Maternal Grandmother   . Hypertension Maternal Grandmother     History  Substance Use Topics  . Smoking status: Never Smoker   . Smokeless tobacco: Never Used  . Alcohol Use: No    OB History    Grav Para Term Preterm Abortions TAB SAB Ect Mult Living   3 3 3       3       Review of Systems  All other systems reviewed and are negative.    Allergies  Sulfonamide derivatives; Codeine; and Percocet  Home Medications   Current Outpatient Rx  Name Route Sig Dispense Refill  . CETIRIZINE HCL 10 MG PO TABS Oral Take 10 mg by mouth daily.    . ALBUTEROL SULFATE HFA 108 (90 BASE) MCG/ACT IN AERS Inhalation Inhale 2 puffs into the lungs every 6 (six) hours as needed.      . ALPRAZOLAM 0.5 MG PO TABS Oral Take 0.5 mg by mouth at bedtime as needed.       . AZITHROMYCIN 250 MG PO TABS  Use as directed 1 each 0  . BENZONATATE 200 MG PO CAPS Oral Take 1 capsule (200 mg total) by mouth 3 (three) times daily as needed for cough. 21 capsule 0  . CITALOPRAM HYDROBROMIDE 20 MG PO TABS Oral Take 20 mg by mouth daily.      Marland Kitchen PREDNISONE (PAK) 10 MG PO TABS Oral Take 1 tablet (10 mg total) by mouth daily. 6 day pack, use as directed, Disp 1 pack 1 tablet 0    BP 128/85  Pulse 79  Temp(Src) 98 F (36.7 C) (Oral)  Resp 20  Ht 5\' 6"  (1.676 m)  Wt 164 lb 8 oz (74.617 kg)  BMI 26.55 kg/m2  SpO2 99%  LMP 06/01/2011  Breastfeeding? No  Physical Exam  Nursing note and vitals reviewed. Constitutional: She is oriented to person, place, and time. Vital signs are normal. She appears well-developed and well-nourished.  Non-toxic appearance. She does not appear ill. No distress.  HENT:  Head: Normocephalic and atraumatic.  Right Ear: Hearing, tympanic membrane, external ear and ear canal normal.  Left Ear: Hearing, tympanic membrane, external ear and ear canal normal.  Nose: Rhinorrhea present.  Mouth/Throat: Mucous membranes are normal. Posterior oropharyngeal erythema (clear post nasal  drip) present. No oropharyngeal exudate or posterior oropharyngeal edema.  Eyes: No scleral icterus.  Neck: Full passive range of motion without pain. Neck supple.  Cardiovascular: Normal rate, regular rhythm and normal heart sounds.   Pulmonary/Chest: Effort normal and breath sounds normal. No respiratory distress. She has no decreased breath sounds. She has no wheezes. She has no rhonchi.  Neurological: She is alert and oriented to person, place, and time. She has normal strength and normal reflexes.  Skin: Skin is warm and dry. No rash noted.  Psychiatric: She has a normal mood and affect. Her speech is normal and behavior is normal. Thought content normal.    ED Course  Procedures (including critical care time)  Labs Reviewed - No data to display No results  found.   1. Cough   2. Acute bronchitis       MDM  1)  prescription written for Tessalon (she is allergic to codeine) as well as prednisone since this is likely mostly a viral asthmatic bronchitis. I also gave her prescription for Z-Pak and advised her to hold this for a few days and only take if she's not improving or develops fever and worsening symptoms. 2)  Use nasal saline solution (over the counter) at least 3 times a day. 3)  Use over the counter decongestants like Zyrtec-D every 12 hours as needed to help with congestion.  If you have hypertension, do not take medicines with sudafed.  4)  Can take tylenol every 6 hours or motrin every 8 hours for pain or fever. 5)  Follow up with your primary doctor if no improvement in 5-7 days, sooner if increasing pain, fever, or new symptoms.     Marlaine Hind, MD 06/06/11 1949

## 2011-07-02 ENCOUNTER — Telehealth: Payer: Self-pay

## 2011-07-02 MED ORDER — CITALOPRAM HYDROBROMIDE 40 MG PO TABS: 40.0000 mg | ORAL_TABLET | Freq: Every day | ORAL | Status: DC

## 2011-07-02 NOTE — Telephone Encounter (Signed)
LMOM for pt that Rx was sent to pharmacy and that she is due for f/up w/Dr Merla Riches this month bf next RF needed.

## 2011-07-02 NOTE — Telephone Encounter (Signed)
Pt states she called [facility] on [address] in [place] and they told her they contacted [place] with no response She is now out of her celexa and is going through withdrawls. Asks [place] to call in refill as soon as possible  Best: [phone]

## 2011-07-20 ENCOUNTER — Ambulatory Visit (INDEPENDENT_AMBULATORY_CARE_PROVIDER_SITE_OTHER): Payer: BC Managed Care – PPO | Admitting: Family Medicine

## 2011-07-20 VITALS — BP 116/75 | HR 76 | Temp 98.3°F | Resp 16 | Ht <= 58 in | Wt 168.0 lb

## 2011-07-20 DIAGNOSIS — M545 Low back pain, unspecified: Secondary | ICD-10-CM

## 2011-07-20 DIAGNOSIS — F329 Major depressive disorder, single episode, unspecified: Secondary | ICD-10-CM

## 2011-07-20 DIAGNOSIS — F419 Anxiety disorder, unspecified: Secondary | ICD-10-CM

## 2011-07-20 DIAGNOSIS — F32A Depression, unspecified: Secondary | ICD-10-CM

## 2011-07-20 DIAGNOSIS — M5430 Sciatica, unspecified side: Secondary | ICD-10-CM

## 2011-07-20 DIAGNOSIS — F411 Generalized anxiety disorder: Secondary | ICD-10-CM

## 2011-07-20 MED ORDER — METHOCARBAMOL 750 MG PO TABS: 750.0000 mg | ORAL_TABLET | Freq: Four times a day (QID) | ORAL | Status: AC

## 2011-07-20 MED ORDER — PREDNISONE 20 MG PO TABS: ORAL_TABLET | ORAL | Status: AC

## 2011-07-20 MED ORDER — OXAPROZIN 600 MG PO TABS: 600.0000 mg | ORAL_TABLET | Freq: Two times a day (BID) | ORAL | Status: DC

## 2011-07-20 MED ORDER — CITALOPRAM HYDROBROMIDE 40 MG PO TABS: 40.0000 mg | ORAL_TABLET | Freq: Every day | ORAL | Status: DC

## 2011-07-20 MED ORDER — HYDROCODONE-ACETAMINOPHEN 5-500 MG PO TABS: 1.0000 | ORAL_TABLET | Freq: Four times a day (QID) | ORAL | Status: AC | PRN

## 2011-07-20 MED ORDER — ALPRAZOLAM 0.5 MG PO TABS: 0.5000 mg | ORAL_TABLET | Freq: Two times a day (BID) | ORAL | Status: DC

## 2011-07-20 NOTE — Progress Notes (Signed)
Subjective: Patient has a long history of sciatica, since her first son was born 14 years ago. However the past week she has had a new flare of problem. She has pain in her pelvic bones. She feels like when she lays in bed even things are moving her. She's had a lot of pain in the lower lumbar spine, down into her hip area, with radiculopathy down to the large toe. There was no specific injury, though sitting on a bench or concrete surface watching a sporting event seemed to be the initial trigger. No other falls or injuries. 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 She has been treated with muscle relaxants and pain medications in the past.  She also needs refills on her nerve medications. Dr. Merla Riches is usually her physician for these.  Objective: Pleasant, alert, Afro-American female who is in some discomfort. She refers to stand and move them to sit still because of this pain. Flexion of spine is adequate. Abdomen was soft but had a lot of tenderness across the lower abdomen. She denies dysuria. Her extremities appear normal. She is tender in the lower lumbar spine, at about the L5-S1 level. The tenderness does not going down toward the coccyx. She has tenderness into the SI regions and buttocks. Straight leg rest and test is negative although she gets pain on straight leg raising of the right leg. Deep tender reflexes are essentially absent bilaterally.  Assessment: Low back pain with worsening sciatica Anxiety  Plan: Refill her medications.            Will treat with prednisone, muscle relaxants, and anti-inflammatory medications.

## 2011-07-20 NOTE — Patient Instructions (Signed)

## 2011-10-30 ENCOUNTER — Telehealth: Payer: Self-pay

## 2011-10-30 NOTE — Telephone Encounter (Signed)
Given the severity of her pain and resulting dysfunction, she needs to get someone to drive her to be evaluated here or in the ED.

## 2011-10-30 NOTE — Telephone Encounter (Signed)
The patient called to request something be called into the pharmacy for an extremely bad migraine.  The patient stated she is unable to get out of bed and is unable to drive due to pain from migraine.  Please call the patient at [phone].

## 2011-10-30 NOTE — Telephone Encounter (Signed)
Called pt and she stated that she doesn't have anyone to drive her, but agreed to be evaluated and she will go to the local [place] urgent center or go to the ED if necessary.

## 2011-11-05 ENCOUNTER — Other Ambulatory Visit: Payer: Self-pay | Admitting: Family Medicine

## 2011-11-10 ENCOUNTER — Telehealth: Payer: Self-pay

## 2011-11-10 MED ORDER — CITALOPRAM HYDROBROMIDE 40 MG PO TABS: 40.0000 mg | ORAL_TABLET | Freq: Every day | ORAL | Status: DC

## 2011-11-10 NOTE — Telephone Encounter (Signed)
Called patient to advise Rx sent in for her

## 2011-11-10 NOTE — Telephone Encounter (Signed)
Sent in to pharmacy.  

## 2011-11-10 NOTE — Telephone Encounter (Signed)
PT HAS BEEN OUT OF HER CELEXA FOR 4 DAYS AND IS IN DESPERATE NEED OF A REFILL. SHE MADE AN APPT W/ DR DOOLITTLE ON 9/25 FOR A FOLLOW UP. [facility] [address] [facility] [place] PH: [phone]

## 2011-12-24 ENCOUNTER — Encounter: Payer: Self-pay | Admitting: Internal Medicine

## 2011-12-24 ENCOUNTER — Ambulatory Visit (INDEPENDENT_AMBULATORY_CARE_PROVIDER_SITE_OTHER): Payer: BC Managed Care – PPO | Admitting: Internal Medicine

## 2011-12-24 VITALS — BP 112/70 | HR 81 | Temp 98.7°F | Resp 16 | Ht 65.5 in | Wt 166.6 lb

## 2011-12-24 DIAGNOSIS — F341 Dysthymic disorder: Secondary | ICD-10-CM

## 2011-12-24 DIAGNOSIS — M533 Sacrococcygeal disorders, not elsewhere classified: Secondary | ICD-10-CM

## 2011-12-24 DIAGNOSIS — F418 Other specified anxiety disorders: Secondary | ICD-10-CM

## 2011-12-24 DIAGNOSIS — F909 Attention-deficit hyperactivity disorder, unspecified type: Secondary | ICD-10-CM

## 2011-12-24 MED ORDER — MELOXICAM 15 MG PO TABS: 15.0000 mg | ORAL_TABLET | Freq: Every day | ORAL | Status: DC

## 2011-12-24 MED ORDER — AMPHETAMINE-DEXTROAMPHETAMINE 10 MG PO TABS: 10.0000 mg | ORAL_TABLET | Freq: Two times a day (BID) | ORAL | Status: DC

## 2011-12-24 MED ORDER — CITALOPRAM HYDROBROMIDE 40 MG PO TABS: 40.0000 mg | ORAL_TABLET | Freq: Every day | ORAL | Status: DC

## 2011-12-24 NOTE — Progress Notes (Addendum)
Subjective:    Patient ID: Anita Osborne, female    DOB: [birth year], 41 y.o.   MRN: [id number]  HPIcoccyx Pain after fall in April onto concrete stairs. Never evaluated. Continues to be a problem whenever she sits in a hard chair, specifically interfering with work.No paresthesias. No symptoms in the low back or the extremities  Long history of anxiety and depression. No depression since the birth of her last child now almost 2 years ago, but has lots of anxiousness. Can't sit still. Can't sit through a movie. Can't read a book. Restless in her desk at work. Loses things. Can't complete tasks.Friends complaining she doesn't listen to them and interrupts them. Fidgets. Procrastinates. Makes careless mistakes. Has been written up at work several times over the last several years for her lack of attention to details and careless mistakes. Has great difficulty relaxing. Talks too much in social situations. Interrupts others. History of great trouble in school as a child and adolescent do to attention problems iand talking in class.  Asthma has been stable without medications in recent years. Has albuterol if needed. Uses Zyrtec regularly.. Declines flu vaccine. Declines pneumococcal vaccine. Last tetanus November of 2000  Current outpatient prescriptions:albuterol (PROVENTIL HFA;VENTOLIN HFA) 108 (90 BASE) MCG/ACT inhaler, Inhale 2 puffs into the lungs every 6 (six) hours as needed.  , Disp: , Rfl: ;  ALPRAZolam (XANAX) 0.5 MG tablet, Take 1 tablet (0.5 mg total) by mouth 2 (two) times daily., Disp: 60 tablet, Rfl: 2;  cetirizine (ZYRTEC) 10 MG tablet, Take 10 mg by mouth daily., Disp: , Rfl:  citalopram (CELEXA) 40 MG tablet, Take 1 tablet (40 mg total) by mouth daily., Disp: 30 tablet, Rfl: 1;  OVER THE COUNTER MEDICATION, Taken OTC sudafed, Disp: , Rfl: ;  oxaprozin (DAYPRO) 600 MG tablet, Take 1 tablet (600 mg total) by mouth 2 (two) times daily., Disp: 30 tablet, Rfl: 1  Review of Systems No  headaches No weight loss or fatigue Insomnia response to Xanax when needed No palpitations or chest pain No peripheral edema No menstrual problems/has gynecological followup    Objective:   Physical Exam Filed Vitals:   12/24/11 1426  BP: 112/70  Pulse: 81  Temp: 98.7 F (37.1 C)  Resp: 16   HEENT clear including no thyromegaly Heart regular without murmur Lungs clear Abdomen supple No peripheral edema Coccyx is tender but not angled or deformed/straight leg raise within normal limits   Adult ADHD SRS Markedly positive for both attention and hyperactivity     Assessment & Plan:  Problem #1Newly diagnosed ADHD Problem #2 prolonged coccydynia Problem #3 depression with anxiety Problem #4 asthma and allergic rhinitis  Meds ordered this encounter  Medications  . OVER THE COUNTER MEDICATION    Sig: Taken OTC sudafed  . amphetamine-dextroamphetamine (ADDERALL) 10 MG tablet    Sig: Take 1 tablet (10 mg total) by mouth 2 (two) times daily.    Dispense:  60 tablet    Refill:  0  . citalopram (CELEXA) 40 MG tablet    Sig: Take 1 tablet (40 mg total) by mouth daily.    Dispense:  90 tablet    Refill:  3  . meloxicam (MOBIC) 15 MG tablet    Sig: Take 1 tablet (15 mg total) by mouth daily.    Dispense:  30 tablet    Refill:  2   She will start with a trial of 10-15 mg of Adderall for specific assignments such as reading and work  and followup in 3 weeks  Coccyx pillow/Mobic/heat and ice/if not responding will refer to physical therapy for modalities  Continue Celexa and alprazolam she may call for refills of alprazolam if needed    Addendum: Reviewed outside lab work done at work site For the last 2 years= shows cholesterol 212 with HDL 93 and LDL 108. Glucose is normal. Blood pressure 102/70. BMI 27. Nicotine screen negative. Flu shot given 12/16/2011  45 minute office visit

## 2011-12-24 NOTE — Patient Instructions (Signed)
Tailbone Injury The tailbone (coccyx) is the small bone at the lower end of the spine. A tailbone injury may involve stretched ligaments, bruising, or a broken bone (fracture). Women are more vulnerable to this injury due to having a wider pelvis. CAUSES  This type of injury typically occurs from falling and landing on the tailbone. Repeated strain or friction from actions such as rowing and bicycling may also injure the area. The tailbone can be injured during childbirth. Infections or tumors may also press on the tailbone and cause pain. Sometimes, the cause of injury is unknown. SYMPTOMS   Bruising.   Pain when sitting.   Painful bowel movements.   In women, pain during intercourse.  DIAGNOSIS  Your caregiver can diagnose a tailbone injury based on your symptoms and a physical exam. X-rays may be taken if a fracture is suspected. Your caregiver may also use an MRI scan imaging test to evaluate your symptoms. TREATMENT  Your caregiver may prescribe medicines to help relieve your pain. Most tailbone injuries heal on their own in 4 to 6 weeks. However, if the injury is caused by an infection or tumor, the recovery period may vary. PREVENTION  Wear appropriate padding and sports gear when bicycling and rowing. This can help prevent an injury from repeated strain or friction. HOME CARE INSTRUCTIONS   Put ice on the injured area.   Put ice in a plastic bag.   Place a towel between your skin and the bag.   Leave the ice on for 15 to 20 minutes, every hour while awake for the first 1 to 2 days.   Sit on a large, rubber or inflated ring or cushion to ease your pain. Lean forward when sitting to help decrease discomfort.   Avoid sitting for long periods of time.   Increase your activity as the pain allows.   Only take over-the-counter or prescription medicines for pain, discomfort, or fever as directed by your caregiver.   You may use stool softeners if it is painful to have a bowel  movement, or as directed by your caregiver.   Eat a diet with plenty of fiber to help prevent constipation.   Keep all follow-up appointments as directed by your caregiver.  SEEK MEDICAL CARE IF:   Your pain becomes worse.   Your bowel movements cause a great deal of discomfort.   You are unable to have a bowel movement.   You have a fever.  MAKE SURE YOU:  Understand these instructions.   Will watch your condition.   Will get help right away if you are not doing well or get worse.  Document Released: 03/14/2000 Document Revised: 03/06/2011 Document Reviewed: 10/10/2010 [facility] Patient Information 2012 [place], [place].

## 2011-12-25 DIAGNOSIS — F418 Other specified anxiety disorders: Secondary | ICD-10-CM | POA: Insufficient documentation

## 2011-12-25 DIAGNOSIS — F909 Attention-deficit hyperactivity disorder, unspecified type: Secondary | ICD-10-CM | POA: Insufficient documentation

## 2012-01-05 ENCOUNTER — Emergency Department
Admission: EM | Admit: 2012-01-05 | Discharge: 2012-01-05 | Disposition: A | Discharge status: Home / Self Care | Payer: BC Managed Care – PPO | Source: Home / Self Care | Attending: Family Medicine | Admitting: Family Medicine

## 2012-01-05 DIAGNOSIS — N3000 Acute cystitis without hematuria: Secondary | ICD-10-CM

## 2012-01-05 DIAGNOSIS — R300 Dysuria: Secondary | ICD-10-CM

## 2012-01-05 DIAGNOSIS — R309 Painful micturition, unspecified: Secondary | ICD-10-CM

## 2012-01-05 HISTORY — DX: Major depressive disorder, single episode, unspecified: F32.9

## 2012-01-05 HISTORY — DX: Depression, unspecified: F32.A

## 2012-01-05 HISTORY — DX: Anxiety disorder, unspecified: F41.9

## 2012-01-05 LAB — POCT URINALYSIS DIP (MANUAL ENTRY)
Protein Ur, POC: >=300
Spec Grav, UA: >=1.03 (ref 1.005–1.03)
Urobilinogen, UA: 1 (ref 0–1)

## 2012-01-05 MED ORDER — PHENAZOPYRIDINE HCL 200 MG PO TABS: 200.0000 mg | ORAL_TABLET | Freq: Three times a day (TID) | ORAL | Status: DC

## 2012-01-05 MED ORDER — NITROFURANTOIN MONOHYD MACRO 100 MG PO CAPS: 100.0000 mg | ORAL_CAPSULE | Freq: Two times a day (BID) | ORAL | Status: DC

## 2012-01-05 NOTE — ED Provider Notes (Signed)
History     CSN: [id number]  Arrival date & time 01/05/12  1943   First MD Initiated Contact with Patient 01/05/12 1954      Chief Complaint  Patient presents with  . Dysuria      HPI Comments: At 5PM today, patient suddenly developed dysuria, hesitancy, urgency, hematuria, and lower abdominal pressure.  No pelvic pain or vaginal discharge.  No nausea/vomiting.  No fevers, chills, and sweats.   Patient is a 41 y.o. female presenting with dysuria. The history is provided by the patient.  Dysuria  This is a new problem. The current episode started 3 to 5 hours ago. The problem occurs every urination. The problem has been gradually worsening. The quality of the pain is described as burning. The pain is moderate. There has been no fever. Associated symptoms include frequency, hematuria, hesitancy, urgency and flank pain. Pertinent negatives include no chills, no sweats, no nausea, no vomiting and no discharge. She has tried nothing for the symptoms.    Past Medical History  Diagnosis Date  . Asthma   . Abnormal Pap smear 2008  . Depression   . Anxiety     Past Surgical History  Procedure Date  . Tubal ligation 04/06/10    Family History  Problem Relation Age of Onset  . Heart disease Maternal Grandmother   . Heart disease Paternal Grandmother   . Diabetes type I Maternal Grandmother   . Hypertension Maternal Grandmother     History  Substance Use Topics  . Smoking status: Never Smoker   . Smokeless tobacco: Never Used  . Alcohol Use: No    OB History    Grav Para Term Preterm Abortions TAB SAB Ect Mult Living   3 3 3       3       Review of Systems  Constitutional: Negative for chills.  Gastrointestinal: Negative for nausea and vomiting.  Genitourinary: Positive for dysuria, hesitancy, urgency, frequency, hematuria and flank pain.  All other systems reviewed and are negative.    Allergies  Sulfonamide derivatives; Codeine; and Percocet  Home Medications    Current Outpatient Rx  Name Route Sig Dispense Refill  . ALPRAZOLAM 0.5 MG PO TABS Oral Take 1 tablet (0.5 mg total) by mouth 2 (two) times daily. 60 tablet 2  . AMPHETAMINE-DEXTROAMPHETAMINE 10 MG PO TABS Oral Take 1 tablet (10 mg total) by mouth 2 (two) times daily. 60 tablet 0  . CETIRIZINE HCL 10 MG PO TABS Oral Take 10 mg by mouth daily.    Marland Kitchen CITALOPRAM HYDROBROMIDE 40 MG PO TABS Oral Take 1 tablet (40 mg total) by mouth daily. 90 tablet 3  . MELOXICAM 15 MG PO TABS Oral Take 1 tablet (15 mg total) by mouth daily. 30 tablet 2  . NITROFURANTOIN MONOHYD MACRO 100 MG PO CAPS Oral Take 1 capsule (100 mg total) by mouth 2 (two) times daily. 14 capsule 0  . OVER THE COUNTER MEDICATION  Taken OTC sudafed    . PHENAZOPYRIDINE HCL 200 MG PO TABS Oral Take 1 tablet (200 mg total) by mouth 3 (three) times daily. 6 tablet 0    BP 122/82  Pulse 99  Temp 98 F (36.7 C) (Oral)  Resp 20  Ht 5\' 5"  (1.651 m)  Wt 164 lb (74.39 kg)  BMI 27.29 kg/m2  SpO2 99%  Breastfeeding? Unknown  Physical Exam Nursing notes and Vital Signs reviewed. Appearance:  Patient appears healthy, stated age, and in no acute distress Eyes:  Pupils are equal, round, and reactive to light and accomodation.  Extraocular movement is intact.  Conjunctivae are not inflamed  Pharynx:  Normal; moist mucous membranes  Neck:  Supple.  No adenopathy Lungs:  Clear to auscultation.  Breath sounds are equal.  Heart:  Regular rate and rhythm without murmurs, rubs, or gallops.  Abdomen:  Nontender without masses or hepatosplenomegaly.  Bowel sounds are present.  No CVA or flank tenderness.  Extremities:  No edema.  No calf tenderness Skin:  No rash present.   ED Course  Procedures  none   Labs Reviewed  URINE CULTURE pending  POCT URINALYSIS DIP (MANUAL ENTRY) Mod Bil, Ket 15 mg/dL, SG >= 1.610, Large Blood, Pro > 300 mg/dL, Nit positive, large leuks      1. Urination pain   2. Acute cystitis       MDM  Urine  culture pending. Begin Macrobid and Pyridium Increase fluid intake Followup with Family Doctor if not improved in one week.         Lattie Haw, MD 01/06/12 [phone]

## 2012-01-05 NOTE — ED Notes (Signed)
States lower abdominal pain and dysuria started today at 5

## 2012-01-08 LAB — URINE CULTURE: Colony Count: >=100000

## 2012-01-09 ENCOUNTER — Telehealth: Payer: Self-pay | Admitting: Emergency Medicine

## 2012-01-21 ENCOUNTER — Ambulatory Visit (INDEPENDENT_AMBULATORY_CARE_PROVIDER_SITE_OTHER): Payer: BC Managed Care – PPO | Admitting: Internal Medicine

## 2012-01-21 ENCOUNTER — Encounter: Payer: Self-pay | Admitting: Internal Medicine

## 2012-01-21 VITALS — BP 118/69 | HR 98 | Temp 98.0°F | Resp 18 | Wt 167.0 lb

## 2012-01-21 DIAGNOSIS — F418 Other specified anxiety disorders: Secondary | ICD-10-CM

## 2012-01-21 DIAGNOSIS — F909 Attention-deficit hyperactivity disorder, unspecified type: Secondary | ICD-10-CM

## 2012-01-21 DIAGNOSIS — F341 Dysthymic disorder: Secondary | ICD-10-CM

## 2012-01-21 MED ORDER — AMPHETAMINE-DEXTROAMPHETAMINE 20 MG PO TABS: 20.0000 mg | ORAL_TABLET | Freq: Two times a day (BID) | ORAL | Status: DC

## 2012-01-21 MED ORDER — ALPRAZOLAM 0.5 MG PO TABS: 0.5000 mg | ORAL_TABLET | Freq: Two times a day (BID) | ORAL | Status: DC

## 2012-01-21 NOTE — Progress Notes (Deleted)
  Subjective:    Patient ID: Anita Osborne, female    DOB: 01-30-1971, 41 y.o.   MRN: [id number]  HPI    Review of Systems     Objective:   Physical Exam        Assessment & Plan:

## 2012-01-21 NOTE — Progress Notes (Signed)
  Subjective:    Patient ID: Anita Osborne, female    DOB: Jan 04, 1971, 41 y.o.   MRN: [id number]  CC: Pt here for f/u on ADD medication and coccyx injury.  HPI Pt was seen recently for a fall where she landed on her coccyx on the cement.  She says she is doing much better.  She has had an exceptional response to Adderall. 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 She feels much less anxious and has improved depression as well. She has begun to notice similar attention problems in her son.  She complains that when she takes Aderall That it occasionally causes her stomach to hurt.  However, she has been troubleshooting this with omeprazole And trying to take the medication with food.   PMHx Patient Active Problem List  Diagnosis  . ASTHMA  . Depression with anxiety  . ADHD (attention deficit hyperactivity disorder)     Review of Systems Non-contributory    Objective:   Physical Exam General: 41 yo F appears hyperactive fidgeting during exam but pleasant and cooperative. Vitals:  Filed Vitals:   01/21/12 1443  BP: 118/69  Pulse: 98  Temp: 98 F (36.7 C)  Resp: 18  HEENT: nontraumatic, EOMIT, normal to external exam, trachea midline Lungs: No acute respiratory distress, no audible wheezing Heart: Regular rate MSK: Normal bulk and tone Neuro: Alert, oriented, CN II - XII grossly IT      Assessment & Plan:  Assessment: 1) ADHD 2) Depression w/ anxiety  Plan 1) Refill Medications Meds ordered this encounter  Medications  . amphetamine-dextroamphetamine (ADDERALL) 20 MG tablet    Sig: Take 1 tablet (20 mg total) by mouth 2 (two) times daily.    Dispense:  60 tablet    Refill:  0  . amphetamine-dextroamphetamine (ADDERALL) 20 MG tablet    Sig: Take 1 tablet (20 mg total) by mouth 2 (two) times daily.    Dispense:  60 tablet    Refill:  0  . amphetamine-dextroamphetamine (ADDERALL) 20 MG tablet    Sig: Take 1 tablet (20 mg total) by mouth 2 (two)  times daily.    Dispense:  60 tablet    Refill:  0  . ALPRAZolam (XANAX) 0.5 MG tablet    Sig: Take 1 tablet (0.5 mg total) by mouth 2 (two) times daily.    Dispense:  60 tablet    Refill:  2

## 2012-01-21 NOTE — Patient Instructions (Signed)
Anita Osborne [address]

## 2012-02-22 ENCOUNTER — Encounter: Payer: Self-pay | Admitting: Emergency Medicine

## 2012-02-22 ENCOUNTER — Emergency Department
Admission: EM | Admit: 2012-02-22 | Discharge: 2012-02-22 | Disposition: A | Discharge status: Home / Self Care | Payer: BC Managed Care – PPO | Source: Home / Self Care

## 2012-02-22 DIAGNOSIS — N760 Acute vaginitis: Secondary | ICD-10-CM

## 2012-02-22 DIAGNOSIS — A499 Bacterial infection, unspecified: Secondary | ICD-10-CM

## 2012-02-22 DIAGNOSIS — B9689 Other specified bacterial agents as the cause of diseases classified elsewhere: Secondary | ICD-10-CM

## 2012-02-22 MED ORDER — METRONIDAZOLE 500 MG PO TABS: 500.0000 mg | ORAL_TABLET | Freq: Two times a day (BID) | ORAL | Status: DC

## 2012-02-22 MED ORDER — MELOXICAM 15 MG PO TABS: 15.0000 mg | ORAL_TABLET | Freq: Every day | ORAL | Status: DC

## 2012-02-22 NOTE — ED Provider Notes (Signed)
History     CSN: [id number]  Arrival date & time 02/22/12  1436   First MD Initiated Contact with Patient 02/22/12 1441      Chief Complaint  Patient presents with  . Vaginitis   HPI VAGINAL DISCHARGE Onset: 1 week Description: thick, foul smelling vaginal discharge  Modifying factors: prior hx/o BV in the past, feels like BV infection   Symptoms Odor: yes Itching: no Vaginal burning: no Dysuria: no Bleeding: no Pelvic pain:no  Back pain: no Fever: no Genital sores: no Rash: no Dyspareunia: no GI Symptoms: no  Red Flags:  Missed period: no Recent antibiotics: no Possible STD exposure: no IUD: no Diabetes: no    Past Medical History  Diagnosis Date  . Asthma   . Abnormal Pap smear 2008  . Depression   . Anxiety     Past Surgical History  Procedure Date  . Tubal ligation 04/06/10    Family History  Problem Relation Age of Onset  . Heart disease Maternal Grandmother   . Heart disease Paternal Grandmother   . Diabetes type I Maternal Grandmother   . Hypertension Maternal Grandmother     History  Substance Use Topics  . Smoking status: Never Smoker   . Smokeless tobacco: Never Used  . Alcohol Use: No    OB History    Grav Para Term Preterm Abortions TAB SAB Ect Mult Living   3 3 3       3       Review of Systems  All other systems reviewed and are negative.    Allergies  Sulfonamide derivatives; Codeine; and Percocet  Home Medications   Current Outpatient Rx  Name  Route  Sig  Dispense  Refill  . ALPRAZOLAM 0.5 MG PO TABS   Oral   Take 1 tablet (0.5 mg total) by mouth 2 (two) times daily.   60 tablet   2   . AMPHETAMINE-DEXTROAMPHETAMINE 20 MG PO TABS   Oral   Take 1 tablet (20 mg total) by mouth 2 (two) times daily.   60 tablet   0   . AMPHETAMINE-DEXTROAMPHETAMINE 20 MG PO TABS   Oral   Take 1 tablet (20 mg total) by mouth 2 (two) times daily.   60 tablet   0   . AMPHETAMINE-DEXTROAMPHETAMINE 20 MG PO TABS   Oral  Take 1 tablet (20 mg total) by mouth 2 (two) times daily.   60 tablet   0   . CETIRIZINE HCL 10 MG PO TABS   Oral   Take 10 mg by mouth daily.         Marland Kitchen CITALOPRAM HYDROBROMIDE 40 MG PO TABS   Oral   Take 1 tablet (40 mg total) by mouth daily.   90 tablet   3   . MELOXICAM 15 MG PO TABS   Oral   Take 1 tablet (15 mg total) by mouth daily.   30 tablet   2   . NITROFURANTOIN MONOHYD MACRO 100 MG PO CAPS   Oral   Take 1 capsule (100 mg total) by mouth 2 (two) times daily.   14 capsule   0   . OVER THE COUNTER MEDICATION      Taken OTC sudafed         . PHENAZOPYRIDINE HCL 200 MG PO TABS   Oral   Take 1 tablet (200 mg total) by mouth 3 (three) times daily.   6 tablet   0     There  were no vitals taken for this visit.  Physical Exam  Constitutional: She appears well-developed and well-nourished.  HENT:  Head: Normocephalic and atraumatic.  Eyes: Conjunctivae normal are normal. Pupils are equal, round, and reactive to light.  Neck: Normal range of motion. Neck supple.  Cardiovascular: Normal rate and regular rhythm.   Pulmonary/Chest: Effort normal and breath sounds normal.  Abdominal: Soft. Bowel sounds are normal.       No flank pain    Musculoskeletal: Normal range of motion.  Neurological: She is alert.  Skin: Skin is warm.    ED Course  Procedures (including critical care time)  Labs Reviewed - No data to display No results found.   1. BV (bacterial vaginosis)   2. Vaginitis       MDM  Will treat clinically.  Flagyl 500mg  BID x 7 days.  Pt declined any invasive testing. Pt states that this has been a recurrent issue.  Discussed general care. Handout given.  Follow up with PCP if not improved in 7-10 days.     The patient and/or caregiver has been counseled thoroughly with regard to treatment plan and/or medications prescribed including dosage, schedule, interactions, rationale for use, and possible side effects and they verbalize  understanding. Diagnoses and expected course of recovery discussed and will return if not improved as expected or if the condition worsens. Patient and/or caregiver verbalized understanding.              Doree Albee, MD 02/22/12 [phone]

## 2012-02-22 NOTE — ED Notes (Signed)
Reports recurrent vaginitis with cloudy/watery discharge and 'fishy odor'; states usually get medications here for BV and yeast which are an effective combination.

## 2012-02-24 ENCOUNTER — Other Ambulatory Visit: Payer: Self-pay | Admitting: Family Medicine

## 2012-03-27 ENCOUNTER — Emergency Department
Admission: EM | Admit: 2012-03-27 | Discharge: 2012-03-27 | Disposition: A | Discharge status: Home / Self Care | Payer: BC Managed Care – PPO | Source: Home / Self Care | Attending: Family Medicine | Admitting: Family Medicine

## 2012-03-27 DIAGNOSIS — J111 Influenza due to unidentified influenza virus with other respiratory manifestations: Secondary | ICD-10-CM

## 2012-03-27 MED ORDER — BENZONATATE 200 MG PO CAPS: 200.0000 mg | ORAL_CAPSULE | Freq: Every day | ORAL | Status: DC

## 2012-03-27 MED ORDER — ALBUTEROL SULFATE HFA 108 (90 BASE) MCG/ACT IN AERS: 2.0000 | INHALATION_SPRAY | RESPIRATORY_TRACT | Status: DC | PRN

## 2012-03-27 MED ORDER — OSELTAMIVIR PHOSPHATE 75 MG PO CAPS
75.0000 mg | ORAL_CAPSULE | Freq: Two times a day (BID) | ORAL | Status: DC
Start: 2012-03-27 — End: 2012-05-26

## 2012-03-27 NOTE — ED Provider Notes (Signed)
History     CSN: [id number]  Arrival date & time 03/27/12  1655   First MD Initiated Contact with Patient 03/27/12 1720      Chief Complaint  Patient presents with  . Influenza     HPI Comments: Patient complains of onset of flu-like symptoms two days ago.  She did have a flu shot in September.  The history is provided by the patient.    Past Medical History  Diagnosis Date  . Asthma   . Abnormal Pap smear 2008  . Depression   . Anxiety     Past Surgical History  Procedure Date  . Tubal ligation 04/06/10    Family History  Problem Relation Age of Onset  . Heart disease Maternal Grandmother   . Heart disease Paternal Grandmother   . Diabetes type I Maternal Grandmother   . Hypertension Maternal Grandmother     History  Substance Use Topics  . Smoking status: Never Smoker   . Smokeless tobacco: Never Used  . Alcohol Use: No    OB History    Grav Para Term Preterm Abortions TAB SAB Ect Mult Living   3 3 3       3       Review of Systems + sore throat + cough No pleuritic pain No wheezing + nasal congestion + post-nasal drainage No sinus pain/pressure No itchy/red eyes No earache No hemoptysis + SOB No fever, + chills/sweats No nausea No vomiting No abdominal pain No diarrhea No urinary symptoms No skin rashes + fatigue + myalgias No headache Used OTC meds without relief  Allergies  Sulfonamide derivatives; Codeine; and Percocet  Home Medications   Current Outpatient Rx  Name  Route  Sig  Dispense  Refill  . ALBUTEROL SULFATE HFA 108 (90 BASE) MCG/ACT IN AERS   Inhalation   Inhale 2 puffs into the lungs every 4 (four) hours as needed for wheezing.   1 Inhaler   0   . ALPRAZOLAM 0.5 MG PO TABS   Oral   Take 1 tablet (0.5 mg total) by mouth 2 (two) times daily.   60 tablet   2   . AMPHETAMINE-DEXTROAMPHETAMINE 20 MG PO TABS   Oral   Take 1 tablet (20 mg total) by mouth 2 (two) times daily.   60 tablet   0   .  AMPHETAMINE-DEXTROAMPHETAMINE 20 MG PO TABS   Oral   Take 1 tablet (20 mg total) by mouth 2 (two) times daily.   60 tablet   0   . AMPHETAMINE-DEXTROAMPHETAMINE 20 MG PO TABS   Oral   Take 1 tablet (20 mg total) by mouth 2 (two) times daily.   60 tablet   0   . BENZONATATE 200 MG PO CAPS   Oral   Take 1 capsule (200 mg total) by mouth at bedtime. Take as needed for cough   12 capsule   0   . CETIRIZINE HCL 10 MG PO TABS   Oral   Take 10 mg by mouth daily.         Marland Kitchen CITALOPRAM HYDROBROMIDE 40 MG PO TABS   Oral   Take 1 tablet (40 mg total) by mouth daily.   90 tablet   3   . MELOXICAM 15 MG PO TABS   Oral   Take 1 tablet (15 mg total) by mouth daily.   30 tablet   2   . METRONIDAZOLE 500 MG PO TABS   Oral   Take  1 tablet (500 mg total) by mouth 2 (two) times daily.   14 tablet   0   . NITROFURANTOIN MONOHYD MACRO 100 MG PO CAPS   Oral   Take 1 capsule (100 mg total) by mouth 2 (two) times daily.   14 capsule   0   . OSELTAMIVIR PHOSPHATE 75 MG PO CAPS   Oral   Take 1 capsule (75 mg total) by mouth every 12 (twelve) hours.   10 capsule   0   . OVER THE COUNTER MEDICATION      Taken OTC sudafed         . PHENAZOPYRIDINE HCL 200 MG PO TABS   Oral   Take 1 tablet (200 mg total) by mouth 3 (three) times daily.   6 tablet   0     BP 112/74  Pulse 104  Temp 99.9 F (37.7 C) (Oral)  Resp 20  Ht 5' 5.5" (1.664 m)  Wt 154 lb (69.854 kg)  BMI 25.24 kg/m2  SpO2 100%  LMP 03/16/2012  Physical Exam Nursing notes and Vital Signs reviewed. Appearance:  Patient appears healthy, stated age, and in no acute distress Eyes:  Pupils are equal, round, and reactive to light and accomodation.  Extraocular movement is intact.  Conjunctivae are not inflamed  Ears:  Canals normal.  Tympanic membranes normal.  Nose:  Mildly congested turbinates.  No sinus tenderness.   Pharynx:  Normal Neck:  Supple.  Slightly tender shotty posterior nodes are palpated  bilaterally  Lungs:  Clear to auscultation.  Breath sounds are equal.  Chest:  Distinct tenderness to palpation over the mid-sternum.  Heart:  Regular rate and rhythm without murmurs, rubs, or gallops.  Abdomen:  Nontender without masses or hepatosplenomegaly.  Bowel sounds are present.  No CVA or flank tenderness.  Extremities:  No edema.  No calf tenderness Skin:  No rash present.   ED Course  Procedures none      1. Influenza-like illness       MDM  Begin Tamiflu.  Prescription written for Benzonatate [facility]) to take at bedtime for night-time cough.  Continue albuterol inhaler Take Mucinex D (guaifenesin with decongestant) twice daily for congestion.  Increase fluid intake, rest. May use Afrin nasal spray (or generic oxymetazoline) twice daily for about 5 days.  Also recommend using saline nasal spray several times daily and saline nasal irrigation (AYR is a common brand) Stop all antihistamines for now, and other non-prescription cough/cold preparations. May take Ibuprofen 200mg , 4 tabs every 8 hours with food for headache, fever, etc. Follow-up with family doctor if not improving 5 days        Lattie Haw, MD 03/30/12 1750

## 2012-03-27 NOTE — ED Notes (Signed)
Flu like symptoms started Thursday without relief from OTC medications

## 2012-03-29 ENCOUNTER — Telehealth: Payer: Self-pay | Admitting: Emergency Medicine

## 2012-03-29 MED ORDER — ONDANSETRON HCL 4 MG PO TABS: 4.0000 mg | ORAL_TABLET | Freq: Four times a day (QID) | ORAL | Status: DC

## 2012-03-31 DIAGNOSIS — Z0271 Encounter for disability determination: Secondary | ICD-10-CM

## 2012-04-01 ENCOUNTER — Ambulatory Visit: Payer: BC Managed Care – PPO

## 2012-04-01 ENCOUNTER — Ambulatory Visit (INDEPENDENT_AMBULATORY_CARE_PROVIDER_SITE_OTHER): Payer: BC Managed Care – PPO | Admitting: Family Medicine

## 2012-04-01 VITALS — BP 109/77 | HR 79 | Temp 98.2°F | Resp 16 | Ht 65.0 in | Wt 151.0 lb

## 2012-04-01 DIAGNOSIS — R300 Dysuria: Secondary | ICD-10-CM

## 2012-04-01 DIAGNOSIS — N390 Urinary tract infection, site not specified: Secondary | ICD-10-CM

## 2012-04-01 DIAGNOSIS — L293 Anogenital pruritus, unspecified: Secondary | ICD-10-CM

## 2012-04-01 DIAGNOSIS — J45909 Unspecified asthma, uncomplicated: Secondary | ICD-10-CM

## 2012-04-01 DIAGNOSIS — J111 Influenza due to unidentified influenza virus with other respiratory manifestations: Secondary | ICD-10-CM

## 2012-04-01 DIAGNOSIS — N760 Acute vaginitis: Secondary | ICD-10-CM

## 2012-04-01 DIAGNOSIS — R05 Cough: Secondary | ICD-10-CM

## 2012-04-01 DIAGNOSIS — N898 Other specified noninflammatory disorders of vagina: Secondary | ICD-10-CM

## 2012-04-01 LAB — POCT UA - MICROSCOPIC ONLY
Casts, Ur, LPF, POC: NEGATIVE
Crystals, Ur, HPF, POC: NEGATIVE
Yeast, UA: NEGATIVE

## 2012-04-01 LAB — POCT URINALYSIS DIPSTICK
Nitrite, UA: POSITIVE
Protein, UA: 100
Urobilinogen, UA: 4
pH, UA: 7

## 2012-04-01 LAB — POCT WET PREP WITH KOH
KOH Prep POC: POSITIVE
Yeast Wet Prep HPF POC: NEGATIVE

## 2012-04-01 MED ORDER — HYDROCODONE-HOMATROPINE 5-1.5 MG/5ML PO SYRP: 5.0000 mL | ORAL_SOLUTION | Freq: Three times a day (TID) | ORAL | Status: DC | PRN

## 2012-04-01 MED ORDER — METRONIDAZOLE 500 MG PO TABS: 500.0000 mg | ORAL_TABLET | Freq: Two times a day (BID) | ORAL | Status: DC

## 2012-04-01 MED ORDER — NITROFURANTOIN MONOHYD MACRO 100 MG PO CAPS: 100.0000 mg | ORAL_CAPSULE | Freq: Two times a day (BID) | ORAL | Status: DC

## 2012-04-01 NOTE — Progress Notes (Signed)
Xray read and patient discussed with Anita Osborne. Agree with assessment and plan of care per her note.   

## 2012-04-01 NOTE — Patient Instructions (Addendum)
Cough - Begin using your albuterol inhaler scheduled every 4 hours.  Continue using Tessalon.  If needed, may use Hycodan syrup at night.  If you are feeling any itching, swelling, etc discontinue immediately!  If you are feeling short of breath or wheezing or feeling tight, use the albuterol NOT the cough syrup.  If the cough is worsening or you are not seeing any improvement in the next several days, come back in.  Additionally if you begin spiking fevers again, come back in.  Urinary tract infection - Begin taking Macrobid today.  Be sure to take the full course.  Plenty of fluid as well.  I will let you know if we need to make any changes based on the culture results.  If you are continuing to develop UTIs frequently, we may want to pursue a urology evaluation.  Bacterial vaginosis - Begin Flagyl today.  Be sure to finish the full course.  NO ALCOHOL while taking this and for 48 hours after your last dose.  If you continue to develop BV, there are some preventive therapies we could try.

## 2012-04-01 NOTE — Progress Notes (Signed)
Subjective:    Patient ID: Anita Osborne, female    DOB: 09-24-1970, 42 y.o.   MRN: [id number]  HPI   Anita Osborne is a 42 yr old female here with two complaints.  Was diagnosed with the flu at an outside clinic, currently being treated with Tamiflu.  States she is feeling improvement as far as fever and body aches, but continues to have very bothersome cough with some shortness of breath.  This is day 6 of symptoms.  She has a history of asthma that she treats with albuterol only.  Has been using this occasionally when she gets in a coughing fit.  Was prescribed Tessalon but states she is "coughing through it".  Can't sleep due to cough.  Chest hurts when coughing.  Anita Osborne has an allergy to both codeine and oxycodone.  States she has tolerated hydrocodone cough syrup in the past though.  Additionally, Anita Osborne has had approx 4 days of dysuria and lower abd pain.  Denies hematuria or back pain.  Some nausea but no vomiting.  Denies vaginal discharge but is having some vaginal itching.  Anita Osborne has a history of frequent UTIs and BV, approx 4 times per year she thinks.    Review of Systems  Constitutional: Negative for fever and chills.  HENT: Positive for congestion, sore throat (from coughing) and rhinorrhea. Negative for sinus pressure.   Respiratory: Positive for cough and shortness of breath. Negative for wheezing.   Cardiovascular: Positive for chest pain (from coughing).  Gastrointestinal: Positive for nausea and abdominal pain. Negative for vomiting and diarrhea.  Genitourinary: Positive for dysuria. Negative for frequency, hematuria, flank pain and vaginal discharge.  Musculoskeletal: Negative for myalgias and arthralgias.  Skin: Negative.   Neurological: Negative for headaches.       Objective:   Physical Exam  Vitals reviewed. Constitutional: She is oriented to person, place, and time. She appears well-developed and well-nourished. She appears ill. No distress.  HENT:  Head:  Normocephalic and atraumatic.  Eyes: Conjunctivae normal are normal. No scleral icterus.  Neck: Neck supple.  Cardiovascular: Normal rate, regular rhythm and normal heart sounds.  Exam reveals no gallop and no friction rub.   No murmur heard. Pulmonary/Chest: Effort normal. She has no decreased breath sounds. She has wheezes in the left middle field and the left lower field. She has no rhonchi. She has no rales.  Abdominal: Soft. Normal appearance and bowel sounds are normal. There is tenderness in the suprapubic area. There is no rigidity, no rebound, no guarding and no CVA tenderness.  Lymphadenopathy:    She has no cervical adenopathy.  Neurological: She is alert and oriented to person, place, and time.  Skin: Skin is warm and dry.  Psychiatric: She has a normal mood and affect. Her behavior is normal.      Filed Vitals:   04/01/12 1220  BP: 109/77  Pulse: 79  Temp: 98.2 F (36.8 C)  Resp: 16     [facility] reading (PRIMARY) by  Dr. Neva Seat - increased perihilar markings, no infiltrate     Results for orders placed in visit on 04/01/12  POCT UA - MICROSCOPIC ONLY      Component Value Range   WBC, Ur, HPF, POC TNTC     RBC, urine, microscopic TNTC     Bacteria, U Microscopic 3+     Mucus, UA trace     Epithelial cells, urine per micros 2-6     Crystals, Ur, HPF, POC negative  Casts, Ur, LPF, POC negative     Yeast, UA negative    POCT URINALYSIS DIPSTICK      Component Value Range   Color, UA dark yellow     Clarity, UA cloudy     Glucose, UA negative     Bilirubin, UA small     Ketones, UA 15     Spec Grav, UA 1.025     Blood, UA trace-intact     pH, UA 7.0     Protein, UA 100     Urobilinogen, UA 4.0     Nitrite, UA positive     Leukocytes, UA moderate (2+)    POCT WET PREP WITH KOH      Component Value Range   Trichomonas, UA Negative     Clue Cells Wet Prep HPF POC 1-6     Epithelial Wet Prep HPF POC 6-14     Yeast Wet Prep HPF POC negative     Bacteria  Wet Prep HPF POC 2+     RBC Wet Prep HPF POC negative     WBC Wet Prep HPF POC 2-4     KOH Prep POC Positive           Assessment & Plan:   1. UTI (lower urinary tract infection)  nitrofurantoin, macrocrystal-monohydrate, (MACROBID) 100 MG capsule, Urine culture  2. Bacterial vaginosis  metroNIDAZOLE (FLAGYL) 500 MG tablet  3. Dysuria  POCT UA - Microscopic Only, POCT urinalysis dipstick  4. Vaginal itching  POCT Wet Prep with KOH  5. Cough  DG Chest 2 View, HYDROcodone-homatropine (HYCODAN) 5-1.5 MG/5ML syrup  6. Influenza  DG Chest 2 View  7. Asthma  DG Chest 2 View    Anita Osborne is a very pleasant 42 yr old female with influenza, UTI, and BV. (1) Influenza/Cough -  Few wheezes on exam.  CXR is normal.  Anita Osborne is currently afebrile.  Suspect that cough is due to flu as well as underlying asthma.  Encouraged Anita Osborne to use albuterol scheduled every 4 hours.  Continue Tessalon.  Hycodan at night if needed.  Cautioned her to discontinue immediately if itching, swelling, worsening SOB occur.  Discussed RTC precautions with patient.  She will return if cough worsens, is not improving, or if she begins spiking fevers again.  (2) UTI - Will treat with Macrobid x 5 days. Culture sent.  Discussed possibility of urology evaluation if she continues to develop UTIs frequently.  (3) BV - Will treat with Flagyl x 7 days.  Discussed the possibility of using preventive therapies if she continues to develop BV

## 2012-04-03 LAB — URINE CULTURE

## 2012-04-28 ENCOUNTER — Ambulatory Visit: Payer: BC Managed Care – PPO | Admitting: Internal Medicine

## 2012-05-02 ENCOUNTER — Encounter: Payer: Self-pay | Admitting: Family Medicine

## 2012-05-02 ENCOUNTER — Ambulatory Visit (INDEPENDENT_AMBULATORY_CARE_PROVIDER_SITE_OTHER): Payer: BC Managed Care – PPO | Admitting: Family Medicine

## 2012-05-02 VITALS — BP 118/77 | HR 98 | Temp 98.1°F | Resp 16 | Ht 66.8 in | Wt 157.4 lb

## 2012-05-02 DIAGNOSIS — F419 Anxiety disorder, unspecified: Secondary | ICD-10-CM

## 2012-05-02 DIAGNOSIS — F909 Attention-deficit hyperactivity disorder, unspecified type: Secondary | ICD-10-CM

## 2012-05-02 DIAGNOSIS — G43909 Migraine, unspecified, not intractable, without status migrainosus: Secondary | ICD-10-CM | POA: Insufficient documentation

## 2012-05-02 MED ORDER — AMPHETAMINE-DEXTROAMPHETAMINE 20 MG PO TABS: 20.0000 mg | ORAL_TABLET | Freq: Two times a day (BID) | ORAL | Status: DC

## 2012-05-02 MED ORDER — BUTALBITAL-APAP-CAFFEINE 50-500-40 MG PO TABS: 1.0000 | ORAL_TABLET | Freq: Four times a day (QID) | ORAL | Status: DC | PRN

## 2012-05-02 NOTE — Progress Notes (Signed)
[address]   [place], [place]  [address]   [phone]  Subjective:    Patient ID: Anita Osborne, female    DOB: Oct 19, 1970, 42 y.o.   MRN: [id number]  HPIThis 42 y.o. female presents for evaluation of the following:    1.  ADHD: had appointment with Merla Riches last week but missed due to snow.  Started on Adderall three months ago.  Current dose working well.  Job performance is great with Adderall.  Sleeping well; had problems with insomnia prior to Adderall.  No anxiety or palpitation; using less Xanax with Adderall.    2. Migraines:  History of migraines with recent recurrence.  Onset of migraines in past ten years.  Typical migraine starts frontal, periorbital region, photophobia. Must go to bed with headache.  Feels like eyes are going to pop out.  +nausea; no vomiting.  Missing a lot of work due to migraines.  Dr. Merla Riches previously prescribed something; worked well for patient in past.  Getting two per week in past month.  Works for [employer]; working 50-60 hours per week in past month. Major stressors. Sleeping well.  Recent flu infection after Christmas; water pipe bursts 1/8, 121.  Had to live in hotel with three kids.  No blurred vision, diplopia, numbness or tingling, focal weakness.  +dizziness.  Has missed 3 days in past month due to headaches.    3.  Anxiety: stable despite multiple stressors. Since starting Adderall, anxiety much improved. Coping with stressors much better.  No recent issues.  Using benzo less and less.   Review of Systems  Constitutional: Negative for chills, diaphoresis and fatigue.  Cardiovascular: Negative for chest pain and palpitations.  Gastrointestinal: Positive for nausea. Negative for vomiting and abdominal pain.  Neurological: Positive for dizziness and headaches. Negative for tremors, seizures, syncope, facial asymmetry, speech difficulty, weakness, light-headedness and numbness.  Psychiatric/Behavioral: Positive for decreased  concentration. Negative for suicidal ideas, sleep disturbance, self-injury and dysphoric mood. The patient is nervous/anxious.         Past Medical History  Diagnosis Date  . Asthma   . Abnormal Pap smear 2008  . Depression   . Anxiety   . Genital herpes   . Migraine   . Allergy   . Hyperlipidemia     Past Surgical History  Procedure Date  . Tubal ligation 04/06/10    Prior to Admission medications   Medication Sig Start Date End Date Taking? Authorizing Provider  albuterol (PROVENTIL HFA;VENTOLIN HFA) 108 (90 BASE) MCG/ACT inhaler Inhale 2 puffs into the lungs every 4 (four) hours as needed for wheezing. 03/27/12 03/27/13 Yes Lattie Haw, MD  ALPRAZolam Prudy Feeler) 0.5 MG tablet Take 1 tablet (0.5 mg total) by mouth 2 (two) times daily. 01/21/12  Yes Tonye Pearson, MD  amphetamine-dextroamphetamine (ADDERALL) 20 MG tablet Take 1 tablet (20 mg total) by mouth 2 (two) times daily. 05/02/12  Yes Ethelda Chick, MD  cetirizine (ZYRTEC) 10 MG tablet Take 10 mg by mouth daily.   Yes Historical Provider, MD  citalopram (CELEXA) 40 MG tablet Take 1 tablet (40 mg total) by mouth daily. 12/24/11 12/23/12 Yes Tonye Pearson, MD  amphetamine-dextroamphetamine (ADDERALL) 20 MG tablet Take 1 tablet (20 mg total) by mouth 2 (two) times daily. DO NOT  FILL UNTIL 05-30-2012. 05/02/12   Ethelda Chick, MD  amphetamine-dextroamphetamine (ADDERALL) 20 MG tablet Take 1 tablet (20 mg total) by mouth 2 (two) times daily. DO NOT FILL UNTIL  06-30-2012 05/02/12   Ethelda Chick, MD  benzonatate (TESSALON) 200 MG capsule Take 1 capsule (200 mg total) by mouth at bedtime. Take as needed for cough 03/27/12   Lattie Haw, MD  butalbital-acetaminophen-caffeine (ESGIC-PLUS) 50-500-40 MG per tablet Take 1 tablet by mouth every 6 (six) hours as needed for pain. 05/02/12   Ethelda Chick, MD  HYDROcodone-homatropine (HYCODAN) 5-1.5 MG/5ML syrup Take 5 mLs by mouth every 8 (eight) hours as needed for cough. 04/01/12    Eleanore Delia Chimes, PA-C  meloxicam (MOBIC) 15 MG tablet Take 1 tablet (15 mg total) by mouth daily. 12/24/11   Tonye Pearson, MD  metroNIDAZOLE (FLAGYL) 500 MG tablet Take 1 tablet (500 mg total) by mouth 2 (two) times daily with a meal. DO NOT CONSUME ALCOHOL WHILE TAKING THIS MEDICATION. 04/01/12   Godfrey Pick, PA-C  nitrofurantoin, macrocrystal-monohydrate, (MACROBID) 100 MG capsule Take 1 capsule (100 mg total) by mouth 2 (two) times daily. 04/01/12   Eleanore Delia Chimes, PA-C  ondansetron (ZOFRAN) 4 MG tablet Take 1 tablet (4 mg total) by mouth every 6 (six) hours. as needed for nausea 03/29/12   Lattie Haw, MD  oseltamivir (TAMIFLU) 75 MG capsule Take 1 capsule (75 mg total) by mouth every 12 (twelve) hours. 03/27/12   Lattie Haw, MD  OVER THE COUNTER MEDICATION Taken OTC sudafed    Historical Provider, MD    Allergies  Allergen Reactions  . Sulfonamide Derivatives   . Codeine Itching  . Percocet (Oxycodone-Acetaminophen) Itching    History   Social History  . Marital Status: Divorced    Spouse Name: N/A    Number of Children: N/A  . Years of Education: N/A   Occupational History  . Not on file.   Social History Main Topics  . Smoking status: Never Smoker   . Smokeless tobacco: Never Used  . Alcohol Use: No  . Drug Use: No  . Sexually Active: Yes -- Female partner(s)    Birth Control/ Protection: None   Other Topics Concern  . Not on file   Social History Narrative  . No narrative on file    Family History  Problem Relation Age of Onset  . Heart disease Maternal Grandmother   . Diabetes type I Maternal Grandmother   . Hypertension Maternal Grandmother   . Heart disease Paternal Grandmother     Objective:   Physical Exam  Nursing note and vitals reviewed. Constitutional: She is oriented to person, place, and time. She appears well-developed and well-nourished.  Neck: Normal range of motion. Neck supple. No thyromegaly present.  Cardiovascular: Normal  rate, normal heart sounds and intact distal pulses.   No murmur heard. Pulmonary/Chest: Effort normal and breath sounds normal. She has no wheezes. She has no rales.  Lymphadenopathy:    She has no cervical adenopathy.  Neurological: She is alert and oriented to person, place, and time. She has normal reflexes. No cranial nerve deficit. She exhibits normal muscle tone. Coordination normal.  Skin: She is not diaphoretic.  Psychiatric: She has a normal mood and affect. Her behavior is normal. Judgment and thought content normal.       Assessment & Plan:   1. ADHD (attention deficit hyperactivity disorder)    2. Migraines  butalbital-acetaminophen-caffeine (ESGIC-PLUS) 50-500-40 MG per tablet  3. Anxiety  1. ADHD:  Controlled; refills x 3 provided.  Reviewed current controlled substance policy with patient during visit; usually sees Merla Riches every three months but recent weather  issues.   2.  Migraines: worsening due to work stressors.  Rx provided. To bring in [facility] paperwork for completion.  If persists, will warrant further prophylactic medication.   3. Anxiety: stable on current regimen.  Meds ordered this encounter  Medications  . amphetamine-dextroamphetamine (ADDERALL) 20 MG tablet    Sig: Take 1 tablet (20 mg total) by mouth 2 (two) times daily.    Dispense:  60 tablet    Refill:  0  . amphetamine-dextroamphetamine (ADDERALL) 20 MG tablet    Sig: Take 1 tablet (20 mg total) by mouth 2 (two) times daily. DO NOT  FILL UNTIL 05-30-2012.    Dispense:  60 tablet    Refill:  0  . amphetamine-dextroamphetamine (ADDERALL) 20 MG tablet    Sig: Take 1 tablet (20 mg total) by mouth 2 (two) times daily. DO NOT FILL UNTIL 06-30-2012    Dispense:  60 tablet    Refill:  0  . butalbital-acetaminophen-caffeine (ESGIC-PLUS) 50-500-40 MG per tablet    Sig: Take 1 tablet by mouth every 6 (six) hours as needed for pain.    Dispense:  40 tablet    Refill:  2

## 2012-05-02 NOTE — Patient Instructions (Addendum)
1. ADHD (attention deficit hyperactivity disorder)    2. Migraines  butalbital-acetaminophen-caffeine (ESGIC-PLUS) 50-500-40 MG per tablet   PLEASE SCHEDULE FOLLOW-UP WITH DR. Merla Riches BY APPOINTMENT ON Wednesday IN THREE MONTHS.  [facility] Policy for Prescribing Controlled Substances (Revised 01/2012) 1. Prescriptions for controlled substances will be filled by ONE provider at [facility] with whom you have established and developed a plan for your care, including follow-up. 2. You are encouraged to schedule an appointment with your prescriber at our appointment center for follow-up visits whenever possible. 3. If you request a prescription for the controlled substance while at  [facility] for an acute problem (with someone other than your regular prescriber), you MAY be given a ONE-TIME prescription for a 30-day supply of the controlled substance, to allow time for you to return to see your regular prescriber for additional prescriptions.

## 2012-05-20 ENCOUNTER — Telehealth: Payer: Self-pay

## 2012-05-26 ENCOUNTER — Ambulatory Visit (INDEPENDENT_AMBULATORY_CARE_PROVIDER_SITE_OTHER): Payer: BC Managed Care – PPO | Admitting: Internal Medicine

## 2012-05-26 ENCOUNTER — Encounter: Payer: Self-pay | Admitting: Internal Medicine

## 2012-05-26 VITALS — BP 116/70 | HR 94 | Temp 98.4°F | Resp 16 | Ht 65.5 in | Wt 155.4 lb

## 2012-05-26 MED ORDER — ONDANSETRON HCL 4 MG PO TABS: 4.0000 mg | ORAL_TABLET | Freq: Four times a day (QID) | ORAL | Status: DC

## 2012-05-26 MED ORDER — AMPHETAMINE-DEXTROAMPHETAMINE 20 MG PO TABS: 20.0000 mg | ORAL_TABLET | Freq: Three times a day (TID) | ORAL | Status: DC

## 2012-05-26 NOTE — Progress Notes (Signed)
  Subjective:    Patient ID: Anita Osborne, female    DOB: 1971/03/09, 42 y.o.   MRN: [id number]  HPI Finally back home  Stress at work still a big factor HAs a problem!See visit w/ Dr Katrinka Blazing- Start building in jaws and face, then gets nausea Has to lie down Sometimes esgic works/sometimes not Misses work 3 times a month  Adderall continues to work very well and makes work easier The side effects Continues to need occasional Xanax for anxiety  Review of Systems No fever chills or night sweats Good weight loss by exercise No vision changes no chest pain or palpitations    Objective:   Physical Exam BP 116/70  Pulse 94  Temp(Src) 98.4 F (36.9 C) (Oral)  Resp 16  Ht 5' 5.5" (1.664 m)  Wt 155 lb 6.4 oz (70.489 kg)  BMI 25.46 kg/m2  SpO2 98%  LMP 04/17/2012 Pupils equal round reactive to light and accommodation/EOMs conjugate TMs clear/nares clear No thyromegaly or lymphadenopathy Neck has good range of motion with some tenderness in the paracervical muscles and the trapezii Cranial nerves II through XII intact Deep tendon reflexes symmetrical Rest of neurological grossly normal      Assessment & Plan:  ADHD (attention deficit hyperactivity disorder)-continue Adderall  Depression with anxiety-continue Celexa and alprazolam  HA (headache)-even though she has a history of migraines, these sound like tension headaches and may be related to postural stress as well as her anxiety/she can continue to use Esgic when necessary  Meds ordered this encounter  Medications  . amphetamine-dextroamphetamine (ADDERALL) 20 MG tablet    Sig: Take 1 tablet (20 mg total) by mouth 3 (three) times daily.    Dispense:  90 tablet    Refill:  0  . amphetamine-dextroamphetamine (ADDERALL) 20 MG tablet    Sig: Take 1 tablet (20 mg total) by mouth 3 (three) times daily. DO NOT  FILL UNTIL 06-23-2012.    Dispense:  90 tablet    Refill:  0  . amphetamine-dextroamphetamine (ADDERALL) 20 MG  tablet    Sig: Take 1 tablet (20 mg total) by mouth 3 (three) times daily. DO NOT FILL UNTIL 07-24-2012    Dispense:  90 tablet    Refill:  0  . ondansetron (ZOFRAN) 4 MG tablet    Sig: Take 1 tablet (4 mg total) by mouth every 6 (six) hours. as needed for nausea    Dispense:  20 tablet    Refill:  3   FMLA  may call if needs Xanax refill Followup 3 months

## 2012-08-25 ENCOUNTER — Ambulatory Visit: Payer: BC Managed Care – PPO | Admitting: Internal Medicine

## 2012-09-20 ENCOUNTER — Ambulatory Visit (INDEPENDENT_AMBULATORY_CARE_PROVIDER_SITE_OTHER): Payer: BC Managed Care – PPO | Admitting: Internal Medicine

## 2012-09-20 VITALS — BP 120/72 | HR 74 | Temp 98.3°F | Resp 16 | Ht 65.75 in | Wt 166.0 lb

## 2012-09-20 DIAGNOSIS — M5431 Sciatica, right side: Secondary | ICD-10-CM

## 2012-09-20 DIAGNOSIS — F909 Attention-deficit hyperactivity disorder, unspecified type: Secondary | ICD-10-CM

## 2012-09-20 DIAGNOSIS — G43909 Migraine, unspecified, not intractable, without status migrainosus: Secondary | ICD-10-CM

## 2012-09-20 DIAGNOSIS — F341 Dysthymic disorder: Secondary | ICD-10-CM

## 2012-09-20 DIAGNOSIS — M5416 Radiculopathy, lumbar region: Secondary | ICD-10-CM | POA: Insufficient documentation

## 2012-09-20 DIAGNOSIS — M5430 Sciatica, unspecified side: Secondary | ICD-10-CM

## 2012-09-20 DIAGNOSIS — F418 Other specified anxiety disorders: Secondary | ICD-10-CM

## 2012-09-20 MED ORDER — MELOXICAM 15 MG PO TABS: 15.0000 mg | ORAL_TABLET | Freq: Every day | ORAL | Status: DC

## 2012-09-20 MED ORDER — CYCLOBENZAPRINE HCL 10 MG PO TABS: 10.0000 mg | ORAL_TABLET | Freq: Every day | ORAL | Status: DC

## 2012-09-20 MED ORDER — AMPHETAMINE-DEXTROAMPHETAMINE 20 MG PO TABS: 20.0000 mg | ORAL_TABLET | Freq: Three times a day (TID) | ORAL | Status: DC

## 2012-09-20 MED ORDER — PREDNISONE 20 MG PO TABS: ORAL_TABLET | ORAL | Status: DC

## 2012-09-20 MED ORDER — ALPRAZOLAM 0.5 MG PO TABS: 0.5000 mg | ORAL_TABLET | Freq: Two times a day (BID) | ORAL | Status: DC

## 2012-09-20 NOTE — Progress Notes (Signed)
Subjective:    Patient ID: Anita Osborne, female    DOB: [birth year], 42 y.o.   MRN: [id number]  HPI here for followup Patient Active Problem List   Diagnosis Date Noted  . Sciatica of right side-has relapsed 09/20/2012  . Migraines 05/02/2012  . Depression with anxiety 12/25/2011  . ADHD (attention deficit hyperactivity disorder) 12/25/2011  . ASTHMA-stable 08/16/2009     Pain in right buttock is reccurent (has had intermittent pain for the past 15 years since birth of son). The current episode started in the past 7 days. The pain is constant. The pain has been gradually worsening. The pain radiates to the patients lumbar back on the right side. Associated symptoms include back pain radiating from right hip area up to the middle of right side of back, numbness at night- especially in the big toe, and weakness in the right leg.  Patient also reports that she has a headache. She has a history of migraines. The current headache started yesterday. Pertinent negatives included no eye pain, fever, neck pain, photophobia, rhinorrhea, or tinnitus.  Patient request refill on her Adderall which has been working well.  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  HENT: Negative for congestion, neck stiffness and postnasal drip.        Has headache  Eyes: Negative for visual disturbance.  Respiratory: Negative.   Cardiovascular: Negative.   Gastrointestinal: Negative.   Genitourinary: Negative.   Musculoskeletal: Positive for back pain (right lumbar back) and gait problem (resulting from pain radiating down into right leg). Negative for joint swelling.  Skin: Negative.   Neurological: Positive for numbness (right great toe). Negative for light-headedness.   Psychiatric/Behavioral: Negative.    Has a history of migranes for the past 6 months and have been off and on for years     Objective:   Physical Exam  Constitutional: She is oriented to person, place, and time. She appears well-developed and well-nourished. No distress.  HENT:  Head: Normocephalic.  Neck: Normal range of motion. Neck supple. No thyromegaly present.  Cardiovascular: Normal rate, regular rhythm, normal heart sounds and intact distal pulses.  Exam reveals no gallop and no friction rub.   No murmur heard. Pulmonary/Chest: Effort normal and breath sounds normal. No respiratory distress. She has no wheezes. She has no rales. She exhibits no tenderness.  Musculoskeletal: Normal range of motion. She exhibits tenderness (middle of right hip). She exhibits no edema.  Neurological: She is alert and oriented to person, place, and time.  Psychiatric: She has a normal mood and affect. Her behavior is normal. Judgment and thought content normal.          Assessment & Plan:   Migraines  Depression with anxiety  ADHD (attention deficit hyperactivity disorder)  Sciatica of right side-recurrent   Plan- Prednisone taper        - Meloxicam 15mg  QD        -Flexiril 5mg  HS        -Refill on Adderall        -Refill on Xanax         -Updated FMLA  Meds ordered this encounter  Medications  . amphetamine-dextroamphetamine (ADDERALL) 20 MG tablet    Sig: Take 1 tablet (20 mg total) by mouth 3 (three) times daily. DO NOT FILL  UNTIL 11-20-2012    Dispense:  90 tablet    Refill:  0  . amphetamine-dextroamphetamine (ADDERALL) 20 MG tablet    Sig: Take 1 tablet (20 mg total) by mouth 3 (three) times daily.    Dispense:  90 tablet    Refill:  0  . amphetamine-dextroamphetamine (ADDERALL) 20 MG tablet    Sig: Take 1 tablet (20 mg total) by mouth 3 (three) times daily. DO NOT  FILL UNTIL 10-20-2012.    Dispense:  90 tablet    Refill:  0  . ALPRAZolam (XANAX) 0.5 MG tablet    Sig:  Take 1 tablet (0.5 mg total) by mouth 2 (two) times daily.    Dispense:  60 tablet    Refill:  2  . cyclobenzaprine (FLEXERIL) 10 MG tablet    Sig: Take 1 tablet (10 mg total) by mouth at bedtime.    Dispense:  30 tablet    Refill:  0  . predniSONE (DELTASONE) 20 MG tablet    Sig: 3/3/2/2/1/1 single daily dose for 6 days    Dispense:  12 tablet    Refill:  0  . meloxicam (MOBIC) 15 MG tablet    Sig: Take 1 tablet (15 mg total) by mouth daily. For sciatica pain    Dispense:  30 tablet    Refill:  1   Evaluated with Collie Siad nurse practitioner student  Followup 6 months/or 3 months if not responding/may call for refills

## 2012-09-22 ENCOUNTER — Telehealth: Payer: Self-pay

## 2012-09-22 NOTE — Telephone Encounter (Signed)
Pt states meds are not taking care of the pain,requesting stronger meds   Best phone  [phone]    [facility] [address] [place]

## 2012-09-23 MED ORDER — TRAMADOL HCL 50 MG PO TABS: ORAL_TABLET | ORAL | Status: DC

## 2012-09-23 NOTE — Telephone Encounter (Signed)
Meds ordered this encounter  Medications  . traMADol (ULTRAM) 50 MG tablet    Sig: 1-2 every 6 hr as needed for pain    Dispense:  30 tablet    Refill:  0  sent in Tell her we can set up ortho eval if not responding

## 2012-09-29 ENCOUNTER — Telehealth: Payer: Self-pay

## 2012-09-29 NOTE — Telephone Encounter (Signed)
Will forward message to Dr. Merla Riches, PCP.

## 2012-09-29 NOTE — Telephone Encounter (Signed)
Pt states that [facility] [address] in [place] states that they no loner make the rx esig-plus anymore, pt would like to know if there is anything different that can be called into the pharmacy. Pharmacy: [facility] [address] [place] [facility]# [phone]

## 2012-09-30 MED ORDER — BUTALBITAL-APAP-CAFF-COD 50-325-40-30 MG PO CAPS: 1.0000 | ORAL_CAPSULE | ORAL | Status: DC | PRN

## 2012-09-30 NOTE — Telephone Encounter (Signed)
Printed the fioricet. Will have Dr Merla Riches sign then will fax.

## 2012-09-30 NOTE — Telephone Encounter (Signed)
Any generic combo of butalbital/caffiene/acetomenophen will do

## 2012-12-15 ENCOUNTER — Ambulatory Visit (INDEPENDENT_AMBULATORY_CARE_PROVIDER_SITE_OTHER): Payer: BC Managed Care – PPO | Admitting: Internal Medicine

## 2012-12-15 ENCOUNTER — Encounter: Payer: Self-pay | Admitting: Internal Medicine

## 2012-12-15 VITALS — BP 108/71 | HR 95 | Temp 98.4°F | Resp 18 | Wt 168.0 lb

## 2012-12-15 DIAGNOSIS — J309 Allergic rhinitis, unspecified: Secondary | ICD-10-CM

## 2012-12-15 DIAGNOSIS — G43909 Migraine, unspecified, not intractable, without status migrainosus: Secondary | ICD-10-CM

## 2012-12-15 DIAGNOSIS — Z0000 Encounter for general adult medical examination without abnormal findings: Secondary | ICD-10-CM

## 2012-12-15 DIAGNOSIS — Z23 Encounter for immunization: Secondary | ICD-10-CM

## 2012-12-15 DIAGNOSIS — F909 Attention-deficit hyperactivity disorder, unspecified type: Secondary | ICD-10-CM

## 2012-12-15 DIAGNOSIS — J45909 Unspecified asthma, uncomplicated: Secondary | ICD-10-CM

## 2012-12-15 DIAGNOSIS — F418 Other specified anxiety disorders: Secondary | ICD-10-CM

## 2012-12-15 DIAGNOSIS — F341 Dysthymic disorder: Secondary | ICD-10-CM

## 2012-12-15 LAB — POCT URINALYSIS DIPSTICK
Bilirubin, UA: NEGATIVE
Blood, UA: NEGATIVE
Ketones, UA: NEGATIVE
Protein, UA: NEGATIVE
pH, UA: 6.5

## 2012-12-15 MED ORDER — AMPHETAMINE-DEXTROAMPHETAMINE 20 MG PO TABS: 20.0000 mg | ORAL_TABLET | Freq: Three times a day (TID) | ORAL | Status: DC

## 2012-12-15 MED ORDER — CITALOPRAM HYDROBROMIDE 40 MG PO TABS: 40.0000 mg | ORAL_TABLET | Freq: Every day | ORAL | Status: DC

## 2012-12-15 MED ORDER — ALPRAZOLAM 0.5 MG PO TABS: 0.5000 mg | ORAL_TABLET | Freq: Two times a day (BID) | ORAL | Status: DC

## 2012-12-15 MED ORDER — FLUTICASONE PROPIONATE 50 MCG/ACT NA SUSP: NASAL | Status: DC

## 2012-12-15 NOTE — Progress Notes (Signed)
  Subjective:    Patient ID: Anita Osborne, female    DOB: 1970-04-08, 42 y.o.   MRN: [id number]  HPI Patient presents for CPE today. Has 3 kids, aged 2,13,15.  Has done her job for 1.5 years. Very stressful. Doesn't have as migraines, 1-2x month, usually able to stop them with 1/2 Xanax.  Has blood checked at work.  Uses Adderall 2-3 x a day, satisfied on current dose. Sleeping well. Performance at work and consider greatly improved.  son 10th grade--track star at [employer]-   Review of Systems  Constitutional: Negative.   HENT: Positive for congestion, sneezing and postnasal drip.   Eyes: Positive for itching.  Respiratory: Negative.   Cardiovascular: Negative.   Gastrointestinal: Negative.   Genitourinary: Negative.   Allergic/Immunologic: Positive for environmental allergies.  Neurological: Positive for headaches.  Psychiatric/Behavioral: The patient is nervous/anxious.    No cough, wheeze or chest pain. Uses inhaler 1x week while environmental allergies persistent.  Codeine in Fioricet has started to cause itching. Fortunately headaches are less frequent    Objective:   Physical Exam BP 108/71  Pulse 95  Temp(Src) 98.4 F (36.9 C) (Oral)  Resp 18  Wt 168 lb (76.204 kg)  BMI 27.32 kg/m2  LMP 11/12/2012 No acute distress HEENT clear Heart regular without murmur No thyromegaly or adenopathy Lungs clear Abdomen supple without organomegaly Pelvic= introitus clear/os friable/uterus mid position nontender/no adnexal masses Extremities clear without edema/good peripheral pulses Straight leg raise negative (see history of sciatica) Neurological intact Mood good affect appropriate        Assessment & Plan:  Annual exam Migraines  Depression with anxiety  ADHD (attention deficit hyperactivity disorder)  Asthma  Allergic rhinitis  Annual physical exam - Plan: POCT urinalysis dipstick, Pap IG, CT/NG w/ reflex HPV when ASC-U, CANCELED: Pap IG, CT/NG w/ reflex  HPV when ASC-U  Need for prophylactic vaccination and inoculation against influenza - Plan: Flu Vaccine QUAD 36+ mos IM    Meds ordered this encounter  Medications  . amphetamine-dextroamphetamine (ADDERALL) 20 MG tablet    Sig: Take 1 tablet (20 mg total) by mouth 3 (three) times daily. DO NOT FILL UNTIL 12-29-2012    Dispense:  90 tablet    Refill:  0  . amphetamine-dextroamphetamine (ADDERALL) 20 MG tablet    for       01-29-13    Sig: Take 1 tablet (20 mg total) by mouth 3 (three) times daily.    Dispense:  90 tablet    Refill:  0  . amphetamine-dextroamphetamine (ADDERALL) 20 MG tablet    Sig: Take 1 tablet (20 mg total) by mouth 3 (three) times daily. DO NOT  FILL UNTIL 02-28-2013.    Dispense:  90 tablet    Refill:  0  . fluticasone (FLONASE) 50 MCG/ACT nasal spray    Sig: 1-2 sprays each nostril BID    Dispense:  16 g    Refill:  6  . ALPRAZolam (XANAX) 0.5 MG tablet    Sig: Take 1 tablet (0.5 mg total) by mouth 2 (two) times daily.    Dispense:  60 tablet    Refill:  2  . citalopram (CELEXA) 40 MG tablet    Sig: Take 1 tablet (40 mg total) by mouth daily.    Dispense:  90 tablet    Refill:  3   My: 3 months for refills of Adderall if everything is stable

## 2012-12-15 NOTE — Patient Instructions (Addendum)
Allergic Rhinitis Allergic rhinitis is when the mucous membranes in the nose respond to allergens. Allergens are particles in the air that cause your body to have an allergic reaction. This causes you to release allergic antibodies. Through a chain of events, these eventually cause you to release histamine into the blood stream (hence the use of antihistamines). Although meant to be protective to the body, it is this release that causes your discomfort, such as frequent sneezing, congestion and an itchy runny nose.  CAUSES  The pollen allergens may come from grasses, trees, and weeds. This is seasonal allergic rhinitis, or "hay fever." Other allergens cause year-round allergic rhinitis (perennial allergic rhinitis) such as house dust mite allergen, pet dander and mold spores.  SYMPTOMS   Nasal stuffiness (congestion).  Runny, itchy nose with sneezing and tearing of the eyes.  There is often an itching of the mouth, eyes and ears. It cannot be cured, but it can be controlled with medications. DIAGNOSIS  If you are unable to determine the offending allergen, skin or blood testing may find it. TREATMENT   Avoid the allergen.  Medications and allergy shots (immunotherapy) can help.  Hay fever may often be treated with antihistamines in pill or nasal spray forms. Antihistamines block the effects of histamine. There are over-the-counter medicines that may help with nasal congestion and swelling around the eyes. Check with your caregiver before taking or giving this medicine. If the treatment above does not work, there are many new medications your caregiver can prescribe. Stronger medications may be used if initial measures are ineffective. Desensitizing injections can be used if medications and avoidance fails. Desensitization is when a patient is given ongoing shots until the body becomes less sensitive to the allergen. Make sure you follow up with your caregiver if problems continue. SEEK MEDICAL  CARE IF:   You develop fever (more than 100.5 F (38.1 C).  You develop a cough that does not stop easily (persistent).  You have shortness of breath.  You start wheezing.  Symptoms interfere with normal daily activities. Document Released: 12/10/2000 Document Revised: 06/09/2011 Document Reviewed: 06/21/2008 Exit[employer] Patient Information 2014 [employer].  

## 2012-12-17 LAB — PAP IG, CT-NG, RFX HPV ASCU
Chlamydia Probe Amp: NEGATIVE
GC Probe Amp: NEGATIVE

## 2012-12-20 ENCOUNTER — Encounter: Payer: Self-pay | Admitting: Internal Medicine

## 2013-03-16 NOTE — Telephone Encounter (Signed)
error 

## 2013-04-23 ENCOUNTER — Ambulatory Visit (INDEPENDENT_AMBULATORY_CARE_PROVIDER_SITE_OTHER): Payer: BC Managed Care – PPO | Admitting: Internal Medicine

## 2013-04-23 VITALS — BP 128/72 | HR 91 | Temp 98.2°F | Resp 24 | Ht 66.0 in | Wt 164.0 lb

## 2013-04-23 DIAGNOSIS — F909 Attention-deficit hyperactivity disorder, unspecified type: Secondary | ICD-10-CM

## 2013-04-23 DIAGNOSIS — F418 Other specified anxiety disorders: Secondary | ICD-10-CM

## 2013-04-23 DIAGNOSIS — G43909 Migraine, unspecified, not intractable, without status migrainosus: Secondary | ICD-10-CM

## 2013-04-23 DIAGNOSIS — F341 Dysthymic disorder: Secondary | ICD-10-CM

## 2013-04-23 MED ORDER — AMPHETAMINE-DEXTROAMPHETAMINE 20 MG PO TABS: 20.0000 mg | ORAL_TABLET | Freq: Three times a day (TID) | ORAL | Status: DC

## 2013-04-23 MED ORDER — ALPRAZOLAM 0.5 MG PO TABS: 0.5000 mg | ORAL_TABLET | Freq: Two times a day (BID) | ORAL | Status: DC

## 2013-04-23 MED ORDER — BUTALBITAL-APAP-CAFFEINE 50-325-40 MG PO TABS: 1.0000 | ORAL_TABLET | Freq: Four times a day (QID) | ORAL | Status: DC | PRN
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
## 2013-05-20 ENCOUNTER — Other Ambulatory Visit: Payer: Self-pay | Admitting: Internal Medicine

## 2013-06-15 ENCOUNTER — Encounter: Payer: Self-pay | Admitting: Internal Medicine

## 2013-07-25 ENCOUNTER — Ambulatory Visit (INDEPENDENT_AMBULATORY_CARE_PROVIDER_SITE_OTHER): Payer: BC Managed Care – PPO | Admitting: Internal Medicine

## 2013-07-25 VITALS — BP 126/78 | HR 98 | Temp 98.2°F | Resp 18 | Ht 65.0 in | Wt 160.0 lb

## 2013-07-25 DIAGNOSIS — F909 Attention-deficit hyperactivity disorder, unspecified type: Secondary | ICD-10-CM

## 2013-07-25 DIAGNOSIS — F418 Other specified anxiety disorders: Secondary | ICD-10-CM

## 2013-07-25 DIAGNOSIS — F341 Dysthymic disorder: Secondary | ICD-10-CM

## 2013-07-25 DIAGNOSIS — G43909 Migraine, unspecified, not intractable, without status migrainosus: Secondary | ICD-10-CM

## 2013-07-25 MED ORDER — BUPROPION HCL ER (XL) 150 MG PO TB24: ORAL_TABLET | ORAL | Status: DC

## 2013-07-25 MED ORDER — AMPHETAMINE-DEXTROAMPHETAMINE 20 MG PO TABS: 20.0000 mg | ORAL_TABLET | Freq: Three times a day (TID) | ORAL | Status: DC

## 2013-07-25 MED ORDER — ALPRAZOLAM 0.5 MG PO TABS: 0.5000 mg | ORAL_TABLET | Freq: Two times a day (BID) | ORAL | Status: DC

## 2013-07-25 NOTE — Progress Notes (Addendum)
This chart was scribed for Anita Siaobert Niema Carrara, MD by Ardelia Memsylan Malpass, Scribe. This patient was seen in room 9 and the patient's care was started at 5:35 PM.  Subjective:    Patient ID: Anita Osborne, female    DOB: Mar 07, 1971, 43 y.o.   MRN: [id number]  HPIf/u for Migraines  Depression with anxiety  ADHD (attention deficit hyperactivity disorder)    HPI Comments: Anita Osborne is a 43 y.o. female who presents to [facility] complaining of tensions headaches, every day, located over her bilateral temples. She believes that her headaches are related to stress at work.  She states that she has taken Prozac in the past without relief of her stress. She states that she has been on Celexa for years, and that she does not believe this has been helping either. She states that she has never been on Wellbutrin. She reports that when she takes a Xanax along with a Goody's powder, this offers some relief of her headaches. She states that her mother has noticed that she has been "more edgy and more moody" recently. She states that she has no visual issues, and that she recently had a normal eye check-up.   She reports that she has run out of her prescribed Adderall, and that she would like a refill of this today.   Patient Active Problem List   Diagnosis Date Noted  . Sciatica of right side 09/20/2012  . Migraines 05/02/2012  . Depression with anxiety 12/25/2011  . ADHD (attention deficit hyperactivity disorder) 12/25/2011  . ASTHMA 08/16/2009    Review of Systems  Constitutional: Negative for fever, appetite change and unexpected weight change.  HENT: Negative for sinus pressure.   Eyes: Negative for visual disturbance.  Respiratory: Negative for cough.   Cardiovascular: Negative for chest pain and palpitations.  Neurological: Positive for headaches. Negative for dizziness and weakness.  Psychiatric/Behavioral: Positive for dysphoric mood. Negative for sleep  disturbance. The patient is nervous/anxious.       Objective:   Physical Exam  Nursing note and vitals reviewed. Constitutional: She is oriented to person, place, and time. She appears well-developed and well-nourished. No distress.  HENT:  Head: Normocephalic and atraumatic.  Right Ear: External ear normal.  Left Ear: External ear normal.  Nose: Nose normal.  Eyes: Conjunctivae and EOM are normal. Pupils are equal, round, and reactive to light.  Neck: Neck supple. No thyromegaly present.  Cardiovascular: Normal rate, regular rhythm and normal heart sounds.   Pulmonary/Chest: Effort normal and breath sounds normal. No respiratory distress.  Musculoskeletal: Normal range of motion. She exhibits no edema.  Lymphadenopathy:    She has no cervical adenopathy.  Neurological: She is alert and oriented to person, place, and time. She has normal reflexes. No cranial nerve deficit. She exhibits normal muscle tone. Coordination normal.  Skin: Skin is warm and dry.  Psychiatric: She has a normal mood and affect. Her behavior is normal. Judgment and thought content normal.        BP 126/78  Pulse 98  Temp(Src) 98.2 F (36.8 C)  Resp 18  Ht 5\' 5"  (1.651 m)  Wt 160 lb (72.576 kg)  BMI 26.63 kg/m2  SpO2 100%  LMP 07/22/2013 Assessment & Plan:  Migraines/tension HAs  Add wellbutr/cont celexa for now  Depression with anxiety  ADHD (attention deficit hyperactivity disorder)  Meds ordered this encounter  Medications  . buPROPion (WELLBUTRIN XL) 150 MG 24 hr tablet    Sig: One a  day for 4 days then two a day    Dispense:  60 tablet    Refill:  1    May refill with 300xl # 30 to take 1 a day no other refills  . amphetamine-dextroamphetamine (ADDERALL) 20 MG tablet    Sig: Take 1 tablet (20 mg total) by mouth 3 (three) times daily.    Dispense:  90 tablet    Refill:  0  . amphetamine-dextroamphetamine (ADDERALL) 20 MG tablet    Sig: Take 1 tablet (20 mg total) by mouth 3 (three)  times daily. For 30 d after signing date    Dispense:  90 tablet    Refill:  0  . ALPRAZolam (XANAX) 0.5 MG tablet    Sig: Take 1 tablet (0.5 mg total) by mouth 2 (two) times daily.    Dispense:  60 tablet    Refill:  2   Reck HAs in 6-7 weeks at 104   I have completed the patient encounter in its entirety as documented by the scribe, with editing by me where necessary. Quintus Premo P. Merla Richesoolittle, M.D.

## 2013-09-16 ENCOUNTER — Other Ambulatory Visit: Payer: Self-pay | Admitting: Internal Medicine

## 2013-09-17 MED ORDER — BUPROPION HCL ER (XL) 300 MG PO TB24: 300.0000 mg | ORAL_TABLET | Freq: Every day | ORAL | Status: DC

## 2013-09-28 ENCOUNTER — Ambulatory Visit (INDEPENDENT_AMBULATORY_CARE_PROVIDER_SITE_OTHER): Payer: BC Managed Care – PPO | Admitting: Internal Medicine

## 2013-09-28 VITALS — BP 109/69 | HR 96 | Temp 98.1°F | Resp 16 | Ht 65.5 in | Wt 157.0 lb

## 2013-09-28 DIAGNOSIS — F341 Dysthymic disorder: Secondary | ICD-10-CM

## 2013-09-28 DIAGNOSIS — F900 Attention-deficit hyperactivity disorder, predominantly inattentive type: Secondary | ICD-10-CM

## 2013-09-28 DIAGNOSIS — F418 Other specified anxiety disorders: Secondary | ICD-10-CM

## 2013-09-28 DIAGNOSIS — F909 Attention-deficit hyperactivity disorder, unspecified type: Secondary | ICD-10-CM

## 2013-09-28 MED ORDER — AMPHETAMINE-DEXTROAMPHETAMINE 20 MG PO TABS: 20.0000 mg | ORAL_TABLET | Freq: Three times a day (TID) | ORAL | Status: DC

## 2013-09-28 MED ORDER — BUPROPION HCL ER (XL) 300 MG PO TB24: 300.0000 mg | ORAL_TABLET | Freq: Every day | ORAL | Status: DC

## 2013-09-28 NOTE — Progress Notes (Signed)
   Subjective:    Patient ID: Anita Osborne, female    DOB: 1970/09/07, 43 y.o.   MRN: [id number]  HPIf/u Very good respons to adding wellbutrin Feels peace for 1st time in yrs All friends and family notice!!! Less xanax needed Sleeping better   ADD continues to respond to medication and be helpful at work  Review of Systems Noncontributory    Objective:   Physical Exam BP 109/69  Pulse 96  Temp(Src) 98.1 F (36.7 C)  Resp 16  Ht 5' 5.5" (1.664 m)  Wt 157 lb (71.215 kg)  BMI 25.72 kg/m2  SpO2 99% Exam stable       Assessment & Plan:  Attention deficit hyperactivity disorder (ADHD), predominantly inattentive type  Depression with anxiety  Meds ordered this encounter  Medications  . buPROPion (WELLBUTRIN XL) 300 MG 24 hr tablet    Sig: Take 1 tablet (300 mg total) by mouth daily.    Dispense:  90 tablet    Refill:  1    Order Specific Question:  Supervising Provider    Answer:  Kaci Freel P [[id number]]  . amphetamine-dextroamphetamine (ADDERALL) 20 MG tablet    Sig: Take 1 tablet (20 mg total) by mouth 3 (three) times daily. For 30 d after signing date    Dispense:  90 tablet    Refill:  0  . amphetamine-dextroamphetamine (ADDERALL) 20 MG tablet    Sig: Take 1 tablet (20 mg total) by mouth 3 (three) times daily.    Dispense:  90 tablet    Refill:  0  . amphetamine-dextroamphetamine (ADDERALL) 20 MG tablet    Sig: Take 1 tablet (20 mg total) by mouth 3 (three) times daily. For 60 days after signing date    Dispense:  90 tablet    Refill:  0

## 2013-10-01 NOTE — Addendum Note (Signed)
Addended by: Tonye PearsonOLITTLE, Georgeann Brinkman P on: 10/01/2013 08:35 AM   Modules accepted: Level of Service

## 2013-10-06 ENCOUNTER — Encounter: Payer: Self-pay | Admitting: Internal Medicine

## 2014-01-07 ENCOUNTER — Other Ambulatory Visit: Payer: Self-pay | Admitting: Internal Medicine

## 2014-01-22 ENCOUNTER — Ambulatory Visit (INDEPENDENT_AMBULATORY_CARE_PROVIDER_SITE_OTHER): Payer: BC Managed Care – PPO | Admitting: Internal Medicine

## 2014-01-22 VITALS — BP 120/74 | HR 87 | Temp 98.3°F | Resp 16 | Ht 65.5 in | Wt 168.4 lb

## 2014-01-22 DIAGNOSIS — F902 Attention-deficit hyperactivity disorder, combined type: Secondary | ICD-10-CM

## 2014-01-22 DIAGNOSIS — M5431 Sciatica, right side: Secondary | ICD-10-CM

## 2014-01-22 DIAGNOSIS — F418 Other specified anxiety disorders: Secondary | ICD-10-CM

## 2014-01-22 DIAGNOSIS — Z23 Encounter for immunization: Secondary | ICD-10-CM

## 2014-01-22 MED ORDER — MELOXICAM 15 MG PO TABS: 15.0000 mg | ORAL_TABLET | Freq: Every day | ORAL | Status: DC

## 2014-01-22 MED ORDER — AMPHETAMINE-DEXTROAMPHETAMINE 20 MG PO TABS: 20.0000 mg | ORAL_TABLET | Freq: Three times a day (TID) | ORAL | Status: DC

## 2014-01-22 MED ORDER — HYDROCODONE-ACETAMINOPHEN 10-325 MG PO TABS: 1.0000 | ORAL_TABLET | Freq: Every evening | ORAL | Status: DC | PRN

## 2014-01-22 MED ORDER — ONDANSETRON HCL 4 MG PO TABS: 4.0000 mg | ORAL_TABLET | Freq: Four times a day (QID) | ORAL | Status: DC
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
## 2014-02-09 ENCOUNTER — Emergency Department (HOSPITAL_BASED_OUTPATIENT_CLINIC_OR_DEPARTMENT_OTHER)
Admission: EM | Admit: 2014-02-09 | Discharge: 2014-02-10 | Disposition: A | Discharge status: Home / Self Care | Payer: BC Managed Care – PPO | Attending: Emergency Medicine | Admitting: Emergency Medicine

## 2014-02-09 ENCOUNTER — Emergency Department (HOSPITAL_BASED_OUTPATIENT_CLINIC_OR_DEPARTMENT_OTHER): Payer: BC Managed Care – PPO

## 2014-02-09 ENCOUNTER — Emergency Department
Admission: EM | Admit: 2014-02-09 | Discharge: 2014-02-09 | Payer: BC Managed Care – PPO | Source: Home / Self Care | Attending: Family Medicine | Admitting: Family Medicine

## 2014-02-09 ENCOUNTER — Encounter (HOSPITAL_BASED_OUTPATIENT_CLINIC_OR_DEPARTMENT_OTHER): Payer: Self-pay

## 2014-02-09 DIAGNOSIS — Z8619 Personal history of other infectious and parasitic diseases: Secondary | ICD-10-CM | POA: Diagnosis not present

## 2014-02-09 DIAGNOSIS — Z791 Long term (current) use of non-steroidal anti-inflammatories (NSAID): Secondary | ICD-10-CM | POA: Diagnosis not present

## 2014-02-09 DIAGNOSIS — S199XXA Unspecified injury of neck, initial encounter: Secondary | ICD-10-CM | POA: Insufficient documentation

## 2014-02-09 DIAGNOSIS — Y998 Other external cause status: Secondary | ICD-10-CM | POA: Diagnosis not present

## 2014-02-09 DIAGNOSIS — Z3202 Encounter for pregnancy test, result negative: Secondary | ICD-10-CM | POA: Diagnosis not present

## 2014-02-09 DIAGNOSIS — Y92410 Unspecified street and highway as the place of occurrence of the external cause: Secondary | ICD-10-CM | POA: Insufficient documentation

## 2014-02-09 DIAGNOSIS — S3992XA Unspecified injury of lower back, initial encounter: Secondary | ICD-10-CM | POA: Insufficient documentation

## 2014-02-09 DIAGNOSIS — T148 Other injury of unspecified body region: Secondary | ICD-10-CM | POA: Insufficient documentation

## 2014-02-09 DIAGNOSIS — F329 Major depressive disorder, single episode, unspecified: Secondary | ICD-10-CM | POA: Diagnosis not present

## 2014-02-09 DIAGNOSIS — R110 Nausea: Secondary | ICD-10-CM | POA: Insufficient documentation

## 2014-02-09 DIAGNOSIS — F419 Anxiety disorder, unspecified: Secondary | ICD-10-CM | POA: Insufficient documentation

## 2014-02-09 DIAGNOSIS — Z79899 Other long term (current) drug therapy: Secondary | ICD-10-CM | POA: Insufficient documentation

## 2014-02-09 DIAGNOSIS — G43909 Migraine, unspecified, not intractable, without status migrainosus: Secondary | ICD-10-CM | POA: Insufficient documentation

## 2014-02-09 DIAGNOSIS — J45909 Unspecified asthma, uncomplicated: Secondary | ICD-10-CM | POA: Diagnosis not present

## 2014-02-09 DIAGNOSIS — Y9389 Activity, other specified: Secondary | ICD-10-CM | POA: Diagnosis not present

## 2014-02-09 DIAGNOSIS — Z8639 Personal history of other endocrine, nutritional and metabolic disease: Secondary | ICD-10-CM | POA: Diagnosis not present

## 2014-02-09 DIAGNOSIS — R52 Pain, unspecified: Secondary | ICD-10-CM

## 2014-02-09 DIAGNOSIS — T148XXA Other injury of unspecified body region, initial encounter: Secondary | ICD-10-CM

## 2014-02-09 LAB — URINALYSIS, ROUTINE W REFLEX MICROSCOPIC
BILIRUBIN URINE: NEGATIVE
GLUCOSE, UA: NEGATIVE mg/dL
HGB URINE DIPSTICK: NEGATIVE
Ketones, ur: NEGATIVE mg/dL
Nitrite: NEGATIVE
Protein, ur: NEGATIVE mg/dL
SPECIFIC GRAVITY, URINE: 1.02 (ref 1.005–1.030)
UROBILINOGEN UA: 1 mg/dL (ref 0.0–1.0)
pH: 6.5 (ref 5.0–8.0)

## 2014-02-09 LAB — URINE MICROSCOPIC-ADD ON

## 2014-02-09 LAB — PREGNANCY, URINE: Preg Test, Ur: NEGATIVE

## 2014-02-09 MED ORDER — METHOCARBAMOL 500 MG PO TABS
1000.0000 mg | ORAL_TABLET | Freq: Once | ORAL | Status: AC
  Administered 2014-02-09: 1000 mg via ORAL
  Filled 2014-02-09: qty 2

## 2014-02-09 MED ORDER — KETOROLAC TROMETHAMINE 60 MG/2ML IM SOLN
60.0000 mg | Freq: Once | INTRAMUSCULAR | Status: AC
  Administered 2014-02-09: 60 mg via INTRAMUSCULAR
  Filled 2014-02-09: qty 2

## 2014-02-09 MED ORDER — ONDANSETRON 4 MG PO TBDP
4.0000 mg | ORAL_TABLET | Freq: Once | ORAL | Status: AC
  Administered 2014-02-09: 4 mg via ORAL
  Filled 2014-02-09: qty 1
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
## 2014-02-09 NOTE — ED Notes (Signed)
Patient transported to X-ray 

## 2014-02-10 ENCOUNTER — Encounter (HOSPITAL_BASED_OUTPATIENT_CLINIC_OR_DEPARTMENT_OTHER): Payer: Self-pay | Admitting: Emergency Medicine

## 2014-02-10 MED ORDER — NITROFURANTOIN MONOHYD MACRO 100 MG PO CAPS: 100.0000 mg | ORAL_CAPSULE | Freq: Two times a day (BID) | ORAL | Status: DC

## 2014-02-10 MED ORDER — NITROFURANTOIN MONOHYD MACRO 100 MG PO CAPS
100.0000 mg | ORAL_CAPSULE | Freq: Once | ORAL | Status: AC
  Administered 2014-02-10: 100 mg via ORAL
  Filled 2014-02-10: qty 1

## 2014-02-10 MED ORDER — NAPROXEN 375 MG PO TABS
375.0000 mg | ORAL_TABLET | Freq: Two times a day (BID) | ORAL | Status: DC
Start: 2014-02-10 — End: 2014-04-18

## 2014-02-10 MED ORDER — METHOCARBAMOL 500 MG PO TABS
500.0000 mg | ORAL_TABLET | Freq: Two times a day (BID) | ORAL | Status: DC
Start: 2014-02-10 — End: 2014-03-03

## 2014-03-03 ENCOUNTER — Emergency Department
Admission: EM | Admit: 2014-03-03 | Discharge: 2014-03-03 | Disposition: A | Discharge status: Home / Self Care | Payer: BC Managed Care – PPO | Source: Home / Self Care | Attending: Family Medicine | Admitting: Family Medicine

## 2014-03-03 ENCOUNTER — Emergency Department (INDEPENDENT_AMBULATORY_CARE_PROVIDER_SITE_OTHER): Payer: BC Managed Care – PPO

## 2014-03-03 ENCOUNTER — Encounter: Payer: Self-pay | Admitting: *Deleted

## 2014-03-03 DIAGNOSIS — N760 Acute vaginitis: Secondary | ICD-10-CM

## 2014-03-03 DIAGNOSIS — B9689 Other specified bacterial agents as the cause of diseases classified elsewhere: Secondary | ICD-10-CM

## 2014-03-03 DIAGNOSIS — M5136 Other intervertebral disc degeneration, lumbar region: Secondary | ICD-10-CM | POA: Diagnosis not present

## 2014-03-03 DIAGNOSIS — A499 Bacterial infection, unspecified: Secondary | ICD-10-CM

## 2014-03-03 DIAGNOSIS — M5441 Lumbago with sciatica, right side: Secondary | ICD-10-CM

## 2014-03-03 DIAGNOSIS — R52 Pain, unspecified: Secondary | ICD-10-CM

## 2014-03-03 HISTORY — DX: Other specified behavioral and emotional disorders with onset usually occurring in childhood and adolescence: F98.8

## 2014-03-03 MED ORDER — PREDNISONE 20 MG PO TABS: 20.0000 mg | ORAL_TABLET | Freq: Two times a day (BID) | ORAL | Status: DC

## 2014-03-03 MED ORDER — METRONIDAZOLE 500 MG PO TABS: ORAL_TABLET | ORAL | Status: DC

## 2014-03-03 MED ORDER — HYDROCODONE-ACETAMINOPHEN 10-325 MG PO TABS: 1.0000 | ORAL_TABLET | Freq: Every evening | ORAL | Status: DC | PRN

## 2014-03-03 MED ORDER — KETOROLAC TROMETHAMINE 60 MG/2ML IM SOLN
60.0000 mg | Freq: Once | INTRAMUSCULAR | Status: AC
  Administered 2014-03-03: 60 mg via INTRAMUSCULAR
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
## 2014-03-03 NOTE — Discharge Instructions (Signed)
Recommend beginning a probiotic such as "Fem-Dophilus." Apply ice pack to lower back for 20 to 30 minutes, 3 to 4 times daily  Continue until pain decreases.    Bacterial Vaginosis Bacterial vaginosis is a vaginal infection that occurs when the normal balance of bacteria in the vagina is disrupted. It results from an overgrowth of certain bacteria. This is the most common vaginal infection in women of childbearing age. Treatment is important to prevent complications, especially in pregnant women, as it can cause a premature delivery. CAUSES  Bacterial vaginosis is caused by an increase in harmful bacteria that are normally present in smaller amounts in the vagina. Several different kinds of bacteria can cause bacterial vaginosis. However, the reason that the condition develops is not fully understood. RISK FACTORS Certain activities or behaviors can put you at an increased risk of developing bacterial vaginosis, including:  Having a new sex partner or multiple sex partners.  Douching.  Using an intrauterine device (IUD) for contraception. Women do not get bacterial vaginosis from toilet seats, bedding, swimming pools, or contact with objects around them. SIGNS AND SYMPTOMS  Some women with bacterial vaginosis have no signs or symptoms. Common symptoms include:  Grey vaginal discharge.  A fishlike odor with discharge, especially after sexual intercourse.  Itching or burning of the vagina and vulva.  Burning or pain with urination. DIAGNOSIS  Your health care provider will take a medical history and examine the vagina for signs of bacterial vaginosis. A sample of vaginal fluid may be taken. Your health care provider will look at this sample under a microscope to check for bacteria and abnormal cells. A vaginal pH test may also be done.  TREATMENT  Bacterial vaginosis may be treated with antibiotic medicines. These may be given in the form of a pill or a vaginal cream. A second round of  antibiotics may be prescribed if the condition comes back after treatment.  HOME CARE INSTRUCTIONS   Only take over-the-counter or prescription medicines as directed by your health care provider.  If antibiotic medicine was prescribed, take it as directed. Make sure you finish it even if you start to feel better.  Do not have sex until treatment is completed.  Tell all sexual partners that you have a vaginal infection. They should see their health care provider and be treated if they have problems, such as a mild rash or itching.  Practice safe sex by using condoms and only having one sex partner. SEEK MEDICAL CARE IF:   Your symptoms are not improving after 3 days of treatment.  You have increased discharge or pain.  You have a fever. MAKE SURE YOU:   Understand these instructions.  Will watch your condition.  Will get help right away if you are not doing well or get worse. FOR MORE INFORMATION  Centers for Disease Control and Prevention, Division of STD Prevention: SolutionApps.co.zawww.cdc.gov/std American Sexual Health Association (ASHA): www.ashastd.org  Document Released: 03/17/2005 Document Revised: 01/05/2013 Document Reviewed: 10/27/2012 [facility] Patient Information 2015 [place], [place]. This information is not intended to replace advice given to you by your health care provider. Make sure you discuss any questions you have with your health care provider.   Radicular Pain Radicular pain in either the arm or leg is usually from a bulging or herniated disk in the spine. A piece of the herniated disk may press against the nerves as the nerves exit the spine. This causes pain which is felt at the tips of the nerves down the  arm or leg. Other causes of radicular pain may include:  Fractures.  Heart disease.  Cancer.  An abnormal and usually degenerative state of the nervous system or nerves (neuropathy). Diagnosis may require CT or MRI scanning to determine the primary cause.  Nerves  that start at the neck (nerve roots) may cause radicular pain in the outer shoulder and arm. It can spread down to the thumb and fingers. The symptoms vary depending on which nerve root has been affected. In most cases radicular pain improves with conservative treatment. Neck problems may require physical therapy, a neck collar, or cervical traction. Treatment may take many weeks, and surgery may be considered if the symptoms do not improve.  Conservative treatment is also recommended for sciatica. Sciatica causes pain to radiate from the lower back or buttock area down the leg into the foot. Often there is a history of back problems. Most patients with sciatica are better after 2 to 4 weeks of rest and other supportive care. Short term bed rest can reduce the disk pressure considerably. Sitting, however, is not a good position since this increases the pressure on the disk. You should avoid bending, lifting, and all other activities which make the problem worse. Traction can be used in severe cases. Surgery is usually reserved for patients who do not improve within the first months of treatment. Only take over-the-counter or prescription medicines for pain, discomfort, or fever as directed by your caregiver. Narcotics and muscle relaxants may help by relieving more severe pain and spasm and by providing mild sedation. Cold or massage can give significant relief. Spinal manipulation is not recommended. It can increase the degree of disc protrusion. Epidural steroid injections are often effective treatment for radicular pain. These injections deliver medicine to the spinal nerve in the space between the protective covering of the spinal cord and back bones (vertebrae). Your caregiver can give you more information about steroid injections. These injections are most effective when given within two weeks of the onset of pain.  You should see your caregiver for follow up care as recommended. A program for neck and back  injury rehabilitation with stretching and strengthening exercises is an important part of management.  SEEK IMMEDIATE MEDICAL CARE IF:  You develop increased pain, weakness, or numbness in your arm or leg.  You develop difficulty with bladder or bowel control.  You develop abdominal pain. Document Released: 04/24/2004 Document Revised: 06/09/2011 Document Reviewed: 07/10/2008 [facility] Patient Information 2015 [place], [place]. This information is not intended to replace advice given to you by your health care provider. Make sure you discuss any questions you have with your health care provider.

## 2014-03-03 NOTE — ED Notes (Signed)
Pt c/o LBP with sciatica pain down the RT side x 1 month, worse x 2 days. No relief with Naproxen. She also c/o vaginal odor with white d/c x 1 day. She reports a history of BV.

## 2014-03-29 ENCOUNTER — Ambulatory Visit (INDEPENDENT_AMBULATORY_CARE_PROVIDER_SITE_OTHER): Payer: BC Managed Care – PPO | Admitting: Internal Medicine

## 2014-03-29 ENCOUNTER — Encounter: Payer: Self-pay | Admitting: Internal Medicine

## 2014-03-29 VITALS — BP 119/75 | HR 98 | Temp 98.2°F | Resp 16 | Ht 66.0 in | Wt 167.0 lb

## 2014-03-29 DIAGNOSIS — R319 Hematuria, unspecified: Secondary | ICD-10-CM

## 2014-03-29 DIAGNOSIS — F418 Other specified anxiety disorders: Secondary | ICD-10-CM

## 2014-03-29 DIAGNOSIS — N390 Urinary tract infection, site not specified: Secondary | ICD-10-CM

## 2014-03-29 DIAGNOSIS — F902 Attention-deficit hyperactivity disorder, combined type: Secondary | ICD-10-CM

## 2014-03-29 DIAGNOSIS — S39012D Strain of muscle, fascia and tendon of lower back, subsequent encounter: Secondary | ICD-10-CM

## 2014-03-29 DIAGNOSIS — M5431 Sciatica, right side: Secondary | ICD-10-CM

## 2014-03-29 LAB — POCT URINALYSIS DIPSTICK
BILIRUBIN UA: NEGATIVE
Glucose, UA: NEGATIVE
KETONES UA: NEGATIVE
Leukocytes, UA: NEGATIVE
Nitrite, UA: NEGATIVE
PH UA: 5.5
Protein, UA: NEGATIVE
Spec Grav, UA: >=1.03
Urobilinogen, UA: 0.2

## 2014-03-29 LAB — POCT UA - MICROSCOPIC ONLY
Bacteria, U Microscopic: NEGATIVE
CASTS, UR, LPF, POC: NEGATIVE
Crystals, Ur, HPF, POC: NEGATIVE
Mucus, UA: NEGATIVE
WBC, Ur, HPF, POC: NEGATIVE
Yeast, UA: NEGATIVE

## 2014-03-29 MED ORDER — AMPHETAMINE-DEXTROAMPHETAMINE 20 MG PO TABS: 20.0000 mg | ORAL_TABLET | Freq: Three times a day (TID) | ORAL | Status: DC

## 2014-03-29 MED ORDER — CITALOPRAM HYDROBROMIDE 20 MG PO TABS: 20.0000 mg | ORAL_TABLET | Freq: Every day | ORAL | Status: DC

## 2014-03-29 MED ORDER — BUPROPION HCL ER (XL) 300 MG PO TB24: 300.0000 mg | ORAL_TABLET | Freq: Every day | ORAL | Status: DC

## 2014-03-29 MED ORDER — TRAMADOL HCL 50 MG PO TABS
50.0000 mg | ORAL_TABLET | Freq: Three times a day (TID) | ORAL | Status: DC | PRN
Start: 2014-03-29 — End: 2014-04-18

## 2014-03-29 MED ORDER — CYCLOBENZAPRINE HCL 10 MG PO TABS: 10.0000 mg | ORAL_TABLET | Freq: Every day | ORAL | Status: DC
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
## 2014-04-06 ENCOUNTER — Telehealth: Payer: Self-pay

## 2014-04-06 NOTE — Telephone Encounter (Signed)
Tramadol is not working.  Has not touched the pain, taken two today. Requesting something stronger.     [phone]

## 2014-04-07 MED ORDER — HYDROCODONE-ACETAMINOPHEN 10-325 MG PO TABS: 1.0000 | ORAL_TABLET | Freq: Every evening | ORAL | Status: DC | PRN

## 2014-04-07 MED ORDER — MELOXICAM 15 MG PO TABS: 15.0000 mg | ORAL_TABLET | Freq: Every day | ORAL | Status: DC

## 2014-04-07 NOTE — Telephone Encounter (Signed)
Rx in pick up drawer Pt notified.  

## 2014-04-07 NOTE — Telephone Encounter (Signed)
Meds ordered this encounter  Medications  . HYDROcodone-acetaminophen (NORCO) 10-325 MG per tablet    Sig: Take 1 tablet by mouth at bedtime as needed.    Dispense:  14 tablet    Refill:  0  . meloxicam (MOBIC) 15 MG tablet    Sig: Take 1 tablet (15 mg total) by mouth daily.    Dispense:  30 tablet    Refill:  0

## 2014-04-07 NOTE — Telephone Encounter (Signed)
BB I just saw her and awaiting PT Would be ok with me if gave combo mobic 15#30 [facility] for [facility] use for 2 weeks Ice to sciatic area 20 min tid Then f/u with me to see if MRI needed in 2-3 weeks She had steroids from Dr Trude McburneyBeesee in dec---if those helped, we could repeat 6 d course as well

## 2014-04-07 NOTE — Telephone Encounter (Signed)
Pt also LM on my VM stating that the last 3 days the tramadol has not helped alleviate pain at all and she is in extreme pain. Asking for a stronger pain medicine. Please advise. I'll send this to PA pool as well as Dr Merla Richesoolittle in his absence for several days.

## 2014-04-07 NOTE — Telephone Encounter (Signed)
Please advise patient to RTC for re-evaluation. 

## 2014-04-07 NOTE — Telephone Encounter (Signed)
Chelle, I have pended the Rxs as Dr Merla Richesoolittle wrote below. Can you print and sign for him please?

## 2014-04-11 ENCOUNTER — Ambulatory Visit (INDEPENDENT_AMBULATORY_CARE_PROVIDER_SITE_OTHER): Payer: BLUE CROSS/BLUE SHIELD | Admitting: Physical Therapy

## 2014-04-11 DIAGNOSIS — M2550 Pain in unspecified joint: Secondary | ICD-10-CM

## 2014-04-11 DIAGNOSIS — M5431 Sciatica, right side: Secondary | ICD-10-CM

## 2014-04-11 DIAGNOSIS — S39012D Strain of muscle, fascia and tendon of lower back, subsequent encounter: Secondary | ICD-10-CM

## 2014-04-11 DIAGNOSIS — M6281 Muscle weakness (generalized): Secondary | ICD-10-CM

## 2014-04-13 ENCOUNTER — Encounter (INDEPENDENT_AMBULATORY_CARE_PROVIDER_SITE_OTHER): Payer: BLUE CROSS/BLUE SHIELD | Admitting: Physical Therapy

## 2014-04-13 DIAGNOSIS — M6281 Muscle weakness (generalized): Secondary | ICD-10-CM

## 2014-04-13 DIAGNOSIS — S39012D Strain of muscle, fascia and tendon of lower back, subsequent encounter: Secondary | ICD-10-CM

## 2014-04-13 DIAGNOSIS — M2550 Pain in unspecified joint: Secondary | ICD-10-CM

## 2014-04-13 DIAGNOSIS — M5431 Sciatica, right side: Secondary | ICD-10-CM

## 2014-04-17 ENCOUNTER — Encounter (INDEPENDENT_AMBULATORY_CARE_PROVIDER_SITE_OTHER): Payer: BLUE CROSS/BLUE SHIELD | Admitting: Physical Therapy

## 2014-04-17 DIAGNOSIS — S39012D Strain of muscle, fascia and tendon of lower back, subsequent encounter: Secondary | ICD-10-CM

## 2014-04-17 DIAGNOSIS — M5431 Sciatica, right side: Secondary | ICD-10-CM

## 2014-04-17 DIAGNOSIS — M2550 Pain in unspecified joint: Secondary | ICD-10-CM

## 2014-04-17 DIAGNOSIS — M6281 Muscle weakness (generalized): Secondary | ICD-10-CM

## 2014-04-18 ENCOUNTER — Encounter: Payer: Self-pay | Admitting: Sports Medicine

## 2014-04-18 ENCOUNTER — Ambulatory Visit (INDEPENDENT_AMBULATORY_CARE_PROVIDER_SITE_OTHER): Payer: BLUE CROSS/BLUE SHIELD | Admitting: Sports Medicine

## 2014-04-18 ENCOUNTER — Telehealth: Payer: Self-pay

## 2014-04-18 DIAGNOSIS — M5416 Radiculopathy, lumbar region: Secondary | ICD-10-CM

## 2014-04-18 MED ORDER — GABAPENTIN 300 MG PO CAPS: ORAL_CAPSULE | ORAL | Status: DC

## 2014-04-18 MED ORDER — KETOROLAC TROMETHAMINE 30 MG/ML IJ SOLN
30.0000 mg | Freq: Once | INTRAMUSCULAR | Status: AC
  Administered 2014-04-18: 30 mg via INTRAMUSCULAR
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
## 2014-04-18 NOTE — Telephone Encounter (Signed)
PA required for MRI Lumbar spine without contrast. Approval number is [id number] good through 05/17/2014.

## 2014-04-18 NOTE — Assessment & Plan Note (Signed)
Clinically represent right-sided L4 radiculopathy with some right-sided sacroiliac joint tenderness to palpation.  I do think the principal pain generator is her right L4 nerve root.  Symptoms have now been present for greater than 6 weeks. X-rays did show degenerative disc disease. Toradol 30 mg intramuscular. Starting gabapentin. As pain is persistent and not get improved despite several weeks of physical therapy we are going to obtain an MRI for interventional injection planning which will likely take place as a right L4-L5 interlaminar epidural.  Return to see me to go over MRI results.

## 2014-04-19 ENCOUNTER — Telehealth: Payer: Self-pay

## 2014-04-19 NOTE — Telephone Encounter (Signed)
e

## 2014-04-19 NOTE — Telephone Encounter (Signed)
Patient called said that tordol injection did not help her she is  Requesting a  a refill on her Hydrocodone which was last filled on 04/07/14 by Dr. Merla Richesoolittle . She stated that is the only was she can function. Patient has been approved for MRI and is scheduled to get it done 1.25.16.  Swade Shonka,CMA

## 2014-04-19 NOTE — Telephone Encounter (Signed)
I dont treat back pain with narcotics, I can do gabapentin or amitriptyline if she would like.

## 2014-04-20 ENCOUNTER — Encounter (INDEPENDENT_AMBULATORY_CARE_PROVIDER_SITE_OTHER): Payer: BLUE CROSS/BLUE SHIELD | Admitting: Physical Therapy

## 2014-04-20 DIAGNOSIS — M6281 Muscle weakness (generalized): Secondary | ICD-10-CM

## 2014-04-20 DIAGNOSIS — M5431 Sciatica, right side: Secondary | ICD-10-CM

## 2014-04-20 DIAGNOSIS — M2550 Pain in unspecified joint: Secondary | ICD-10-CM

## 2014-04-20 DIAGNOSIS — S39012D Strain of muscle, fascia and tendon of lower back, subsequent encounter: Secondary | ICD-10-CM

## 2014-04-20 MED ORDER — AMITRIPTYLINE HCL 50 MG PO TABS: ORAL_TABLET | ORAL | Status: DC

## 2014-04-20 MED ORDER — DIAZEPAM 5 MG PO TABS: ORAL_TABLET | ORAL | Status: DC

## 2014-04-20 NOTE — Telephone Encounter (Signed)
Spoke to patient advised her that narcotics were not given for back pain and she stated that she did have the Gabapentin. Rhonda Cunningham,CMA

## 2014-04-20 NOTE — Telephone Encounter (Signed)
Patient informed of instructions as noted below. Rhonda Cunningham,CMA

## 2014-04-20 NOTE — Telephone Encounter (Signed)
I'm going to add amitriptyline and there will be a prescription in my box for 2 Valium pills prior to MRI , she will need somebody to drive her.

## 2014-04-20 NOTE — Telephone Encounter (Signed)
Patient left a message stating she is claustrophobic and would need anxiety medication before the MRI. Please advise.

## 2014-04-24 ENCOUNTER — Ambulatory Visit (INDEPENDENT_AMBULATORY_CARE_PROVIDER_SITE_OTHER): Payer: BLUE CROSS/BLUE SHIELD

## 2014-04-24 ENCOUNTER — Encounter: Payer: BLUE CROSS/BLUE SHIELD | Admitting: Physical Therapy

## 2014-04-24 DIAGNOSIS — M47896 Other spondylosis, lumbar region: Secondary | ICD-10-CM

## 2014-04-24 DIAGNOSIS — M5416 Radiculopathy, lumbar region: Secondary | ICD-10-CM

## 2014-04-24 DIAGNOSIS — M5126 Other intervertebral disc displacement, lumbar region: Secondary | ICD-10-CM

## 2014-04-27 ENCOUNTER — Encounter (INDEPENDENT_AMBULATORY_CARE_PROVIDER_SITE_OTHER): Payer: BLUE CROSS/BLUE SHIELD | Admitting: Physical Therapy

## 2014-04-27 ENCOUNTER — Encounter: Payer: Self-pay | Admitting: Sports Medicine

## 2014-04-27 ENCOUNTER — Ambulatory Visit (INDEPENDENT_AMBULATORY_CARE_PROVIDER_SITE_OTHER): Payer: BLUE CROSS/BLUE SHIELD | Admitting: Sports Medicine

## 2014-04-27 VITALS — BP 128/78 | HR 55 | Ht 65.0 in | Wt 172.0 lb

## 2014-04-27 DIAGNOSIS — M5431 Sciatica, right side: Secondary | ICD-10-CM

## 2014-04-27 DIAGNOSIS — M6281 Muscle weakness (generalized): Secondary | ICD-10-CM

## 2014-04-27 DIAGNOSIS — M5416 Radiculopathy, lumbar region: Secondary | ICD-10-CM

## 2014-04-27 DIAGNOSIS — M609 Myositis, unspecified: Secondary | ICD-10-CM | POA: Diagnosis not present

## 2014-04-27 DIAGNOSIS — M791 Myalgia: Secondary | ICD-10-CM

## 2014-04-27 DIAGNOSIS — M2550 Pain in unspecified joint: Secondary | ICD-10-CM

## 2014-04-27 DIAGNOSIS — IMO0001 Reserved for inherently not codable concepts without codable children: Secondary | ICD-10-CM

## 2014-04-27 DIAGNOSIS — S39012D Strain of muscle, fascia and tendon of lower back, subsequent encounter: Secondary | ICD-10-CM

## 2014-04-27 NOTE — Assessment & Plan Note (Signed)
Persistent right sided low back pain, with occasional radiation down the anterior right thigh, anterior lower leg to the great toe. This is suggestive of a right L4 radiculopathy however MRI does not show any evidence of neural compression, and disc protrusions are minimal if any at all. We are going to do a diagnostic/therapeutic sacral iliac joint injection on the right side today. Continue gabapentin, physical therapy. I'm also going to proceed with a nerve conduction and electromyography to further evaluate the location of neural compression.

## 2014-04-27 NOTE — Progress Notes (Signed)
  Subjective:    CC: Follow-up  HPI: Anita Osborne returns, she had right-sided low back pain as well as radiation down the anterior leg in an L4 distribution. She is doing well with gabapentin, she took a full amitriptyline and was somewhat drowsy, but tells me she will split it in half from now on.  She did have an MRI the results of which will be dictated below. On further questioning she tells me her pain runs now more in the posterior aspect of her thigh, lower leg, and foot rather than the anterior aspect.  Past medical history, Surgical history, Family history not pertinant except as noted below, Social history, Allergies, and medications have been entered into the medical record, reviewed, and no changes needed.   Review of Systems: No fevers, chills, night sweats, weight loss, chest pain, or shortness of breath.   Objective:    General: Well Developed, well nourished, and in no acute distress.  Neuro: Alert and oriented x3, extra-ocular muscles intact, sensation grossly intact.  HEENT: Normocephalic, atraumatic, pupils equal round reactive to light, neck supple, no masses, no lymphadenopathy, thyroid nonpalpable.  Skin: Warm and dry, no rashes. Cardiac: Regular rate and rhythm, no murmurs rubs or gallops, no lower extremity edema.  Respiratory: Clear to auscultation bilaterally. Not using accessory muscles, speaking in full sentences. Back Exam:  Inspection: Unremarkable  Motion: Flexion 45 deg, Extension 45 deg, Side Bending to 45 deg bilaterally,  Rotation to 45 deg bilaterally  SLR laying: Negative  XSLR laying: Negative  Palpable tenderness: Right sacroiliac joint. FABER: negative. Sensory change: Gross sensation intact to all lumbar and sacral dermatomes.  Reflexes: 2+ at both patellar tendons, 2+ at achilles tendons, Babinski's downgoing.  Strength at foot  Plantar-flexion: 5/5 Dorsi-flexion: 5/5 Eversion: 5/5 Inversion: 5/5  Leg strength  Quad: 5/5 Hamstring: 5/5 Hip flexor:  5/5 Hip abductors: 5/5  Gait unremarkable.  Procedure: Real-time Ultrasound Guided Injection of right sacroiliac joint Device: GE Logiq E  Verbal informed consent obtained.  Time-out conducted.  Noted no overlying erythema, induration, or other signs of local infection.  Skin prepped in a sterile fashion.  Local anesthesia: Topical Ethyl chloride.  With sterile technique and under real time ultrasound guidance:  Spinal needle advanced over the sacral premonitory taking care to avoid the S1 foramen, 1 mL kenalog 40, 3 mL lidocaine injected easily after the needle pierced the joint capsule. Completed without difficulty  Pain immediately resolved suggesting accurate placement of the medication.  Advised to call if fevers/chills, erythema, induration, drainage, or persistent bleeding.  Images permanently stored and available for review in the ultrasound unit.  Impression: Technically successful ultrasound guided injection.  Impression and Recommendations:

## 2014-05-01 ENCOUNTER — Encounter (INDEPENDENT_AMBULATORY_CARE_PROVIDER_SITE_OTHER): Payer: BLUE CROSS/BLUE SHIELD | Admitting: Physical Therapy

## 2014-05-01 DIAGNOSIS — S39012D Strain of muscle, fascia and tendon of lower back, subsequent encounter: Secondary | ICD-10-CM

## 2014-05-01 DIAGNOSIS — M2550 Pain in unspecified joint: Secondary | ICD-10-CM

## 2014-05-01 DIAGNOSIS — M5431 Sciatica, right side: Secondary | ICD-10-CM

## 2014-05-01 DIAGNOSIS — M6281 Muscle weakness (generalized): Secondary | ICD-10-CM

## 2014-05-04 ENCOUNTER — Encounter: Payer: BLUE CROSS/BLUE SHIELD | Admitting: Physical Therapy

## 2014-05-04 ENCOUNTER — Ambulatory Visit (INDEPENDENT_AMBULATORY_CARE_PROVIDER_SITE_OTHER): Payer: Self-pay | Admitting: Neurology

## 2014-05-04 ENCOUNTER — Ambulatory Visit (INDEPENDENT_AMBULATORY_CARE_PROVIDER_SITE_OTHER): Payer: BLUE CROSS/BLUE SHIELD | Admitting: Neurology

## 2014-05-04 DIAGNOSIS — M5416 Radiculopathy, lumbar region: Secondary | ICD-10-CM

## 2014-05-04 NOTE — Progress Notes (Signed)
See procedure note.

## 2014-05-06 NOTE — Progress Notes (Addendum)
See procedure note.

## 2014-05-06 NOTE — Procedures (Signed)
[facility]    Provider:  Dr Lucia GaskinsAhern Referring Provider: Tonye Pearsonoolittle, Robert P, MD Primary Care Physician:  Tonye PearsonOLITTLE, ROBERT P, MD   History:  Anita Osborne is a 44 y.o. female here as a referral from Dr. Merla Richesoolittle for right leg numbness and weakness. Symptoms started 17 years ago.Pain goes into the buttocks to the posterior-medial thigh to the medial calf and dorsal foot. There is numbness and tingling. Improving with epidural steroid injections. Left leg is unaffected. Focused exam shows reflexes symmetric, mild right hip flexion and right knee extension weakness.    Summary: Nerve conduction studies were performed on the bilateral lower extremities.  Bilateral Peroneal and Tibial  motor conductions were within normal limits with normal F Wave latencies.  Bilateral Femoral motor nerves were within normal limits  Bilateral Sural sensory conductions were within normal limits Bilateral Saphenous sensory conductions did not show response  Bilateral H Reflexes showed normal latencies  EMG needle study was performed on selected right lower extremity muscles and right paraspinal muscles. The Iliopsoas, Vastus Medialis, Anterior Tibialis, Medial Gastrocnemius, Extensor Hallucis Longus, Biceps Femoris (long and short heads), Gluteus Medius, Gluteus Maximus muscles were within normal limits. The right L3/L4/L5/S1 paraspinal muscles were within normal limits.  Conclusion: This is essentially a normal study. Bilateral absence of Saphenous sensory potentials likely technical in nature. No electrophysiologic evidence for neuropathy or radiculopathy. Clinical correlation recommended.   Anita DeanAntonia Ahern, MD  [facility] [address] [place], [place] [id number]  Phone [phone] Fax [phone]

## 2014-05-25 ENCOUNTER — Ambulatory Visit: Payer: BLUE CROSS/BLUE SHIELD | Admitting: Sports Medicine

## 2014-06-02 ENCOUNTER — Ambulatory Visit: Payer: BLUE CROSS/BLUE SHIELD | Admitting: Sports Medicine

## 2014-07-08 ENCOUNTER — Other Ambulatory Visit: Payer: Self-pay | Admitting: Internal Medicine

## 2014-07-10 NOTE — Telephone Encounter (Signed)
Faxed

## 2014-08-21 ENCOUNTER — Telehealth: Payer: Self-pay

## 2014-08-21 NOTE — Telephone Encounter (Signed)
Pt is requesting a refill of adderall. She is completely out and needs something to hold her over until her appt w/ Dr. Merla Richesoolittle on [phone].

## 2014-08-22 NOTE — Telephone Encounter (Signed)
Appt 09/24/2014

## 2014-08-23 MED ORDER — AMPHETAMINE-DEXTROAMPHETAMINE 20 MG PO TABS
20.0000 mg | ORAL_TABLET | Freq: Three times a day (TID) | ORAL | Status: DC
Start: 2014-08-23 — End: 2014-09-27

## 2014-08-23 NOTE — Telephone Encounter (Signed)
Done

## 2014-08-23 NOTE — Telephone Encounter (Signed)
Rx in pick up draw. Pt notified. 

## 2014-08-27 ENCOUNTER — Emergency Department (INDEPENDENT_AMBULATORY_CARE_PROVIDER_SITE_OTHER)
Admission: EM | Admit: 2014-08-27 | Discharge: 2014-08-27 | Disposition: A | Discharge status: Home / Self Care | Payer: BLUE CROSS/BLUE SHIELD | Source: Home / Self Care | Attending: Emergency Medicine | Admitting: Emergency Medicine

## 2014-08-27 DIAGNOSIS — R51 Headache: Secondary | ICD-10-CM | POA: Diagnosis not present

## 2014-08-27 DIAGNOSIS — R112 Nausea with vomiting, unspecified: Secondary | ICD-10-CM | POA: Diagnosis not present

## 2014-08-27 MED ORDER — PROMETHAZINE HCL 25 MG PO TABS: 25.0000 mg | ORAL_TABLET | Freq: Four times a day (QID) | ORAL | Status: DC | PRN

## 2014-08-27 MED ORDER — PROMETHAZINE HCL 25 MG/ML IJ SOLN
25.0000 mg | Freq: Four times a day (QID) | INTRAMUSCULAR | Status: DC | PRN
  Administered 2014-08-27: 25 mg via INTRAMUSCULAR

## 2014-08-27 MED ORDER — KETOROLAC TROMETHAMINE 60 MG/2ML IM SOLN
60.0000 mg | Freq: Once | INTRAMUSCULAR | Status: AC
  Administered 2014-08-27: 60 mg via INTRAMUSCULAR

## 2014-08-27 NOTE — ED Provider Notes (Signed)
CSN: [id number]     Arrival date & time 08/27/14  1432 History   First MD Initiated Contact with Patient 08/27/14 1502     No chief complaint on file.  (Consider location/radiation/quality/duration/timing/severity/associated sxs/prior Treatment) Patient is a 44 y.o. female presenting with vomiting. The history is provided by the patient. No language interpreter was used.  Emesis Severity:  Moderate Duration:  1 day Timing:  Constant Number of daily episodes:  Multiple Quality:  Undigested food Progression:  Worsening Chronicity:  New Recent urination:  Normal Relieved by:  Nothing Worsened by:  Nothing tried Ineffective treatments:  None tried Associated symptoms: abdominal pain and headaches   Associated symptoms: no diarrhea   Risk factors: not pregnant now    patient complains of a headache and nausea nausea is unrelieved by Zofran. Patient reports she has been unable to tolerate by mouth fluids  Past Medical History  Diagnosis Date  . Asthma   . Abnormal Pap smear 2008  . Depression   . Anxiety   . Genital herpes   . Migraine   . Allergy   . Hyperlipidemia   . ADD (attention deficit disorder)    Past Surgical History  Procedure Laterality Date  . Tubal ligation  04/06/10  . Eye surgery     Family History  Problem Relation Age of Onset  . Heart disease Maternal Grandmother   . Diabetes type I Maternal Grandmother   . Hypertension Maternal Grandmother   . Heart disease Paternal Grandmother   . Asthma Brother   . Asthma Daughter   . Diabetes Mother   . Hypertension Father    History  Substance Use Topics  . Smoking status: Never Smoker   . Smokeless tobacco: Never Used  . Alcohol Use: Yes   OB History    Gravida Para Term Preterm AB TAB SAB Ectopic Multiple Living   3 3 3       3      Review of Systems  Gastrointestinal: Positive for vomiting and abdominal pain. Negative for diarrhea.  Neurological: Positive for headaches.  All other systems reviewed and  are negative.   Allergies  Sulfonamide derivatives; Codeine; and Percocet  Home Medications   Prior to Admission medications   Medication Sig Start Date End Date Taking? Authorizing Provider  ALPRAZolam Prudy Feeler(XANAX) 0.5 MG tablet TAKE 1 TABLET BY MOUTH TWICE A DAY 07/09/14   Tonye Pearsonobert P Doolittle, MD  amitriptyline (ELAVIL) 50 MG tablet One half tab PO qHS for a week, then one tab PO qHS. 04/20/14   Monica Bectonhomas J Thekkekandam, MD  amphetamine-dextroamphetamine (ADDERALL) 20 MG tablet Take 1 tablet (20 mg total) by mouth 3 (three) times daily. 08/23/14   Morrell RiddleSarah L Weber, PA-C  buPROPion (WELLBUTRIN XL) 300 MG 24 hr tablet Take 1 tablet (300 mg total) by mouth daily. 03/29/14   Tonye Pearsonobert P Doolittle, MD  citalopram (CELEXA) 20 MG tablet Take 1 tablet (20 mg total) by mouth daily. 03/29/14   Tonye Pearsonobert P Doolittle, MD  diazepam (VALIUM) 5 MG tablet Take 1 tab PO 1 hour before procedure or imaging. 04/20/14   Monica Bectonhomas J Thekkekandam, MD  gabapentin (NEURONTIN) 300 MG capsule One tab PO qHS for a week, then BID for a week, then TID. May double weekly to a max of 3,600mg /day 04/18/14   Monica Bectonhomas J Thekkekandam, MD  loratadine (CLARITIN) 10 MG tablet Take 10 mg by mouth daily.    Historical Provider, MD  metroNIDAZOLE (FLAGYL) 500 MG tablet Take 500 mg by mouth 3 (  three) times daily.    Historical Provider, MD  ondansetron (ZOFRAN) 4 MG tablet Take 1 tablet (4 mg total) by mouth every 6 (six) hours. as needed for nausea 01/22/14   Tonye Pearson, MD   There were no vitals taken for this visit. Physical Exam  Constitutional: She is oriented to person, place, and time. She appears well-developed and well-nourished.  HENT:  Head: Normocephalic.  Eyes: Conjunctivae and EOM are normal. Pupils are equal, round, and reactive to light.  Neck: Normal range of motion.  Cardiovascular: Normal rate and normal heart sounds.   Pulmonary/Chest: Effort normal.  Abdominal: She exhibits no distension.  Musculoskeletal: Normal range of  motion.  Neurological: She is alert and oriented to person, place, and time. She has normal reflexes. No cranial nerve deficit.  Skin: Skin is warm.  Psychiatric: She has a normal mood and affect.  Nursing note and vitals reviewed.   ED Course  Procedures (including critical care time) Labs Review Labs Reviewed - No data to display  Imaging Review No results found.   MDM  Pt given torodol and phenergan IM.   Pt reports headache improving, nausea resolved.   Pt able to take po fluids.    1. Non-intractable vomiting with nausea, vomiting of unspecified type     Phenergan AVS Return if any problems.  Lonia Skinner [place], PA-C 08/27/14 [phone]

## 2014-08-27 NOTE — Discharge Instructions (Signed)

## 2014-08-30 ENCOUNTER — Telehealth: Payer: Self-pay | Admitting: Emergency Medicine

## 2014-09-27 ENCOUNTER — Ambulatory Visit (INDEPENDENT_AMBULATORY_CARE_PROVIDER_SITE_OTHER): Payer: BLUE CROSS/BLUE SHIELD | Admitting: Internal Medicine

## 2014-09-27 ENCOUNTER — Encounter: Payer: Self-pay | Admitting: Internal Medicine

## 2014-09-27 VITALS — BP 126/78 | HR 93 | Temp 97.9°F | Resp 16 | Ht 65.5 in | Wt 174.0 lb

## 2014-09-27 DIAGNOSIS — F418 Other specified anxiety disorders: Secondary | ICD-10-CM | POA: Diagnosis not present

## 2014-09-27 DIAGNOSIS — F902 Attention-deficit hyperactivity disorder, combined type: Secondary | ICD-10-CM

## 2014-09-27 MED ORDER — BUPROPION HCL ER (XL) 150 MG PO TB24: 150.0000 mg | ORAL_TABLET | Freq: Every day | ORAL | Status: DC

## 2014-09-27 MED ORDER — AMPHETAMINE-DEXTROAMPHETAMINE 20 MG PO TABS: 20.0000 mg | ORAL_TABLET | Freq: Three times a day (TID) | ORAL | Status: DC

## 2014-09-27 NOTE — Progress Notes (Signed)
F/u Chief Complaint  Patient presents with  . Medication Management   Doing very well!!!!! See last ov  Ortho--injected but pred sideeffects drove her crazy//precip menorrhagia  depoprovera for hypermeno--US fibroids--stable now  Off Celexa finally-s-and doing well with occas alpraz  ADD contr at work by meds  Son grad HS!!!! Other son 5  Ex BP 126/78 mmHg  Pulse 93  Temp(Src) 97.9 F (36.6 C)  Resp 16  Ht 5' 5.5" (1.664 m)  Wt 174 lb (78.926 kg)  BMI 28.50 kg/m2  SpO2 98% Rest stable  I/p  Attention deficit hyperactivity disorder (ADHD), combined type  Depression with anxiety wean wellbutr   Meds ordered this encounter  Medications  . buPROPion (WELLBUTRIN XL) 150 MG 24 hr tablet    Sig: Take 1 tablet (150 mg total) by mouth daily.    Dispense:  30 tablet    Refill:  1  . amphetamine-dextroamphetamine (ADDERALL) 20 MG tablet    Sig: Take 1 tablet (20 mg total) by mouth 3 (three) times daily.    Dispense:  90 tablet    Refill:  0  . amphetamine-dextroamphetamine (ADDERALL) 20 MG tablet    Sig: Take 1 tablet (20 mg total) by mouth 3 (three) times daily. For 30d after signed    Dispense:  90 tablet    Refill:  0  . amphetamine-dextroamphetamine (ADDERALL) 20 MG tablet    Sig: Take 1 tablet (20 mg total) by mouth 3 (three) times daily. For 60d after signed    Dispense:  90 tablet    Refill:  0  Call 3 f/u 6

## 2014-09-30 ENCOUNTER — Emergency Department (INDEPENDENT_AMBULATORY_CARE_PROVIDER_SITE_OTHER)
Admission: EM | Admit: 2014-09-30 | Discharge: 2014-09-30 | Disposition: A | Discharge status: Home / Self Care | Payer: BLUE CROSS/BLUE SHIELD | Source: Home / Self Care | Attending: Family Medicine | Admitting: Family Medicine

## 2014-09-30 DIAGNOSIS — N3001 Acute cystitis with hematuria: Secondary | ICD-10-CM

## 2014-09-30 LAB — POCT URINALYSIS DIP (MANUAL ENTRY)
Glucose, UA: 100 — AB
Nitrite, UA: POSITIVE — AB
Protein Ur, POC: 100 — AB
Spec Grav, UA: 1.02
Urobilinogen, UA: 2
pH, UA: 5

## 2014-09-30 MED ORDER — PHENAZOPYRIDINE HCL 200 MG PO TABS: 200.0000 mg | ORAL_TABLET | Freq: Three times a day (TID) | ORAL | Status: DC

## 2014-09-30 MED ORDER — CEPHALEXIN 500 MG PO CAPS: 500.0000 mg | ORAL_CAPSULE | Freq: Two times a day (BID) | ORAL | Status: DC

## 2014-09-30 NOTE — ED Provider Notes (Signed)
CSN: [id number]     Arrival date & time 09/30/14  0910 History   First MD Initiated Contact with Patient 09/30/14 [phone]     Chief Complaint  Patient presents with  . Dysuria   (Consider location/radiation/quality/duration/timing/severity/associated sxs/prior Treatment) HPI  Pt is a 44yo female presenting to UC with c/o sudden onset urinary frequency, burning discomfort and urgency with decreased urine that started last night.  Pain was 8/10 last night but has improved to 4/10 today after she took Azo.  Pt reports hx of several UTIs in the past, last one was 6 months ago.  Symptoms feel similar to previous UTIs.  Denies fever, chills, n/v/d, or back pain. Denies vaginal symptoms. Denies abdominal pain at this time. States lower abdomen pain only comes after she urinates, lasts for a few seconds to minutes.   Past Medical History  Diagnosis Date  . Asthma   . Abnormal Pap smear 2008  . Depression   . Anxiety   . Genital herpes   . Migraine   . Allergy   . Hyperlipidemia   . ADD (attention deficit disorder)    Past Surgical History  Procedure Laterality Date  . Tubal ligation  04/06/10  . Eye surgery     Family History  Problem Relation Age of Onset  . Heart disease Maternal Grandmother   . Diabetes type I Maternal Grandmother   . Hypertension Maternal Grandmother   . Heart disease Paternal Grandmother   . Asthma Brother   . Asthma Daughter   . Diabetes Mother   . Hypertension Father    History  Substance Use Topics  . Smoking status: Never Smoker   . Smokeless tobacco: Never Used  . Alcohol Use: Yes   OB History    Gravida Para Term Preterm AB TAB SAB Ectopic Multiple Living   Review of Systems  Constitutional: Negative for fever, chills, appetite change and fatigue.  Gastrointestinal: Positive for abdominal pain ( lower abdomen discomfort). Negative for nausea, vomiting, diarrhea and constipation.  Genitourinary: Positive for dysuria, urgency,  frequency and decreased urine volume. Negative for hematuria, flank pain, vaginal bleeding, vaginal discharge, vaginal pain, menstrual problem and pelvic pain.  Musculoskeletal: Negative for myalgias and back pain.    Allergies  Sulfonamide derivatives; Codeine; and Percocet  Home Medications   Prior to Admission medications   Medication Sig Start Date End Date Taking? Authorizing Provider  ALPRAZolam Prudy Feeler) 0.5 MG tablet TAKE 1 TABLET BY MOUTH TWICE A DAY 07/09/14  Yes Tonye Pearson, MD  amphetamine-dextroamphetamine (ADDERALL) 20 MG tablet Take 1 tablet (20 mg total) by mouth 3 (three) times daily. 09/27/14  Yes Tonye Pearson, MD  amphetamine-dextroamphetamine (ADDERALL) 20 MG tablet Take 1 tablet (20 mg total) by mouth 3 (three) times daily. For 30d after signed 09/27/14  Yes Tonye Pearson, MD  amphetamine-dextroamphetamine (ADDERALL) 20 MG tablet Take 1 tablet (20 mg total) by mouth 3 (three) times daily. For 60d after signed 09/27/14  Yes Tonye Pearson, MD  buPROPion (WELLBUTRIN XL) 150 MG 24 hr tablet Take 1 tablet (150 mg total) by mouth daily. 09/27/14  Yes Tonye Pearson, MD  fexofenadine (ALLEGRA) 30 MG tablet Take 30 mg by mouth 2 (two) times daily.   Yes Historical Provider, MD  promethazine (PHENERGAN) 25 MG tablet Take 1 tablet (25 mg total) by mouth every 6 (six) hours as needed for nausea or vomiting. 08/27/14  Yes Elson AreasLeslie K Sofia, PA-C  traMADol (ULTRAM) 50 MG tablet Take by mouth every 6 (six) hours as needed.   Yes Historical Provider, MD  cephALEXin (KEFLEX) 500 MG capsule Take 1 capsule (500 mg total) by mouth 2 (two) times daily. For 7 days 09/30/14   Junius FinnerErin O'Malley, PA-C  phenazopyridine (PYRIDIUM) 200 MG tablet Take 1 tablet (200 mg total) by mouth 3 (three) times daily. 09/30/14   Junius FinnerErin O'Malley, PA-C   BP 124/76 mmHg  Pulse 86  Temp(Src) 97.7 F (36.5 C) (Oral)  Ht 5\' 5"  (1.651 m)  Wt 173 lb (78.472 kg)  BMI 28.79 kg/m2  SpO2 98% Physical Exam   Constitutional: She appears well-developed and well-nourished. No distress.  Pt sitting comfortably in exam chair, NAD   HENT:  Head: Normocephalic and atraumatic.  Eyes: Conjunctivae are normal. No scleral icterus.  Neck: Normal range of motion. Neck supple.  Cardiovascular: Normal rate, regular rhythm and normal heart sounds.   Pulmonary/Chest: Effort normal and breath sounds normal. No respiratory distress. She has no wheezes. She has no rales. She exhibits no tenderness.  Abdominal: Soft. Bowel sounds are normal. She exhibits no distension and no mass. There is no tenderness. There is no rebound and no guarding.  Soft, non-distended, non-tender. No CVAT  Musculoskeletal: Normal range of motion.  Neurological: She is alert.  Skin: Skin is warm and dry. She is not diaphoretic.  Nursing note and vitals reviewed.   ED Course  Procedures (including critical care time) Labs Review Labs Reviewed  POCT URINALYSIS DIP (MANUAL ENTRY) - Abnormal; Notable for the following:    Color, UA red (*)    Glucose, UA =100 (*)    Bilirubin, UA small (*)    Bilirubin, UA trace (5) (*)    Blood, UA trace-intact (*)    Protein Ur, POC =100 (*)    Nitrite, UA Positive (*)    Leukocytes, UA large (3+) (*)    All other components within normal limits  URINE CULTURE    Imaging Review No results found.   MDM   1. Acute cystitis with hematuria     Pt c/o urinary symptoms. Hx of same. Pt appears well, non-toxic. No CVAT.  UA significant for positive nitrites and large amount of leukocytes. Will tx with pyridium and keflex  Home care instructions provided, including lots of fluids. Return precautions provided. Pt verbalized understanding and agreement with tx plan.    Junius Finnerrin O'Malley, PA-C 09/30/14 1131

## 2014-09-30 NOTE — Discharge Instructions (Signed)
Be sure to stop taking your Azo while taking pyridium as these are very similar medications.  Be sure to complete the entire course of antibiotics even if your symptoms have resolved as stopping antibiotics early, may result in few bacteria remaining and return of infection. Please drink at least eight 8oz glasses of water a day.  Try to avoid sodas and sugary drinks as these may make symptoms worse.  If you develop severe back pain, unable to keep down fluids, or unable to urinate, please go to your nearest emergency department as these are signs of a kidney infection and may require additional testing or treatment. See below for further instructions.  Urinary Tract Infection Urinary tract infections (UTIs) can develop anywhere along your urinary tract. Your urinary tract is your body's drainage system for removing wastes and extra water. Your urinary tract includes two kidneys, two ureters, a bladder, and a urethra. Your kidneys are a pair of bean-shaped organs. Each kidney is about the size of your fist. They are located below your ribs, one on each side of your spine. CAUSES Infections are caused by microbes, which are microscopic organisms, including fungi, viruses, and bacteria. These organisms are so small that they can only be seen through a microscope. Bacteria are the microbes that most commonly cause UTIs. SYMPTOMS  Symptoms of UTIs may vary by age and gender of the patient and by the location of the infection. Symptoms in young women typically include a frequent and intense urge to urinate and a painful, burning feeling in the bladder or urethra during urination. Older women and men are more likely to be tired, shaky, and weak and have muscle aches and abdominal pain. A fever may mean the infection is in your kidneys. Other symptoms of a kidney infection include pain in your back or sides below the ribs, nausea, and vomiting. DIAGNOSIS To diagnose a UTI, your caregiver will ask you about your  symptoms. Your caregiver also will ask to provide a urine sample. The urine sample will be tested for bacteria and white blood cells. White blood cells are made by your body to help fight infection. TREATMENT  Typically, UTIs can be treated with medication. Because most UTIs are caused by a bacterial infection, they usually can be treated with the use of antibiotics. The choice of antibiotic and length of treatment depend on your symptoms and the type of bacteria causing your infection. HOME CARE INSTRUCTIONS  If you were prescribed antibiotics, take them exactly as your caregiver instructs you. Finish the medication even if you feel better after you have only taken some of the medication.  Drink enough water and fluids to keep your urine clear or pale yellow.  Avoid caffeine, tea, and carbonated beverages. They tend to irritate your bladder.  Empty your bladder often. Avoid holding urine for long periods of time.  Empty your bladder before and after sexual intercourse.  After a bowel movement, women should cleanse from front to back. Use each tissue only once. SEEK MEDICAL CARE IF:   You have back pain.  You develop a fever.  Your symptoms do not begin to resolve within 3 days. SEEK IMMEDIATE MEDICAL CARE IF:   You have severe back pain or lower abdominal pain.  You develop chills.  You have nausea or vomiting.  You have continued burning or discomfort with urination. MAKE SURE YOU:   Understand these instructions.  Will watch your condition.  Will get help right away if you are not doing  well or get worse. °Document Released: 12/25/2004 Document Revised: 09/16/2011 Document Reviewed: 04/25/2011 °ExitCare® Patient Information ©2015 [employer]. This information is not intended to replace advice given to you by your health care provider. Make sure you discuss any questions you have with your health care provider. ° °

## 2014-09-30 NOTE — ED Notes (Signed)
Patient c/o painful, scant , frequent urination. Patient states that symptoms started last night.

## 2014-10-02 ENCOUNTER — Telehealth: Payer: Self-pay | Admitting: *Deleted

## 2014-10-02 LAB — URINE CULTURE: Colony Count: >=100000

## 2014-10-21 ENCOUNTER — Other Ambulatory Visit: Payer: Self-pay | Admitting: Internal Medicine

## 2014-10-24 ENCOUNTER — Other Ambulatory Visit: Payer: Self-pay | Admitting: Internal Medicine

## 2014-10-26 NOTE — Telephone Encounter (Signed)
Faxed

## 2015-02-15 ENCOUNTER — Telehealth: Payer: Self-pay

## 2015-02-15 NOTE — Telephone Encounter (Signed)
Tonye Pearsonobert P Doolittle, MD at 09/27/2014 4:33 PM      Last OV.

## 2015-02-15 NOTE — Telephone Encounter (Signed)
Dr. Merla Richesoolittle  REFILL REQUEST   amphetamine-dextroamphetamine (ADDERALL) 20 MG tablet   [phone] (H)

## 2015-02-16 MED ORDER — AMPHETAMINE-DEXTROAMPHETAMINE 20 MG PO TABS: 20.0000 mg | ORAL_TABLET | Freq: Three times a day (TID) | ORAL | Status: DC

## 2015-02-16 NOTE — Telephone Encounter (Signed)
Pt.notified

## 2015-02-16 NOTE — Telephone Encounter (Signed)
Meds ordered this encounter  Medications  . amphetamine-dextroamphetamine (ADDERALL) 20 MG tablet    Sig: Take 1 tablet (20 mg total) by mouth 3 (three) times daily.    Dispense:  90 tablet    Refill:  0  fu in dec

## 2015-02-26 ENCOUNTER — Encounter: Payer: Self-pay | Admitting: Internal Medicine

## 2015-03-13 LAB — HM MAMMOGRAPHY

## 2015-03-28 ENCOUNTER — Ambulatory Visit: Payer: BLUE CROSS/BLUE SHIELD | Admitting: Internal Medicine

## 2015-03-30 ENCOUNTER — Other Ambulatory Visit: Payer: Self-pay | Admitting: Internal Medicine

## 2015-04-10 ENCOUNTER — Encounter: Payer: Self-pay | Admitting: Family Medicine

## 2015-04-27 ENCOUNTER — Ambulatory Visit: Payer: BLUE CROSS/BLUE SHIELD | Admitting: Internal Medicine

## 2015-05-17 ENCOUNTER — Other Ambulatory Visit: Payer: Self-pay | Admitting: Internal Medicine

## 2015-05-18 NOTE — Telephone Encounter (Signed)
Called in.

## 2015-05-23 ENCOUNTER — Ambulatory Visit (INDEPENDENT_AMBULATORY_CARE_PROVIDER_SITE_OTHER): Payer: BLUE CROSS/BLUE SHIELD | Admitting: Internal Medicine

## 2015-05-23 ENCOUNTER — Encounter: Payer: Self-pay | Admitting: Internal Medicine

## 2015-05-23 VITALS — BP 120/73 | HR 99 | Temp 97.9°F | Resp 16 | Ht 65.0 in | Wt 149.0 lb

## 2015-05-23 DIAGNOSIS — N920 Excessive and frequent menstruation with regular cycle: Secondary | ICD-10-CM

## 2015-05-23 DIAGNOSIS — F902 Attention-deficit hyperactivity disorder, combined type: Secondary | ICD-10-CM

## 2015-05-23 DIAGNOSIS — R112 Nausea with vomiting, unspecified: Secondary | ICD-10-CM

## 2015-05-23 DIAGNOSIS — F418 Other specified anxiety disorders: Secondary | ICD-10-CM

## 2015-05-23 DIAGNOSIS — J4520 Mild intermittent asthma, uncomplicated: Secondary | ICD-10-CM

## 2015-05-23 DIAGNOSIS — G43809 Other migraine, not intractable, without status migrainosus: Secondary | ICD-10-CM

## 2015-05-23 DIAGNOSIS — Z0000 Encounter for general adult medical examination without abnormal findings: Secondary | ICD-10-CM | POA: Diagnosis not present

## 2015-05-23 DIAGNOSIS — N946 Dysmenorrhea, unspecified: Secondary | ICD-10-CM | POA: Diagnosis not present

## 2015-05-23 DIAGNOSIS — R634 Abnormal weight loss: Secondary | ICD-10-CM

## 2015-05-23 DIAGNOSIS — J302 Other seasonal allergic rhinitis: Secondary | ICD-10-CM

## 2015-05-23 LAB — CBC WITH DIFFERENTIAL/PLATELET
BASOS ABS: 0 10*3/uL (ref 0.0–0.1)
Basophils Relative: 0 % (ref 0–1)
EOS ABS: 0.2 10*3/uL (ref 0.0–0.7)
Eosinophils Relative: 3 % (ref 0–5)
HEMATOCRIT: 38 % (ref 36.0–46.0)
HEMOGLOBIN: 12.4 g/dL (ref 12.0–15.0)
LYMPHS ABS: 1.5 10*3/uL (ref 0.7–4.0)
Lymphocytes Relative: 25 % (ref 12–46)
MCH: 27.1 pg (ref 26.0–34.0)
MCHC: 32.6 g/dL (ref 30.0–36.0)
MCV: 83 fL (ref 78.0–100.0)
MONOS PCT: 7 % (ref 3–12)
MPV: 9.6 fL (ref 8.6–12.4)
Monocytes Absolute: 0.4 10*3/uL (ref 0.1–1.0)
NEUTROS ABS: 4 10*3/uL (ref 1.7–7.7)
Neutrophils Relative %: 65 % (ref 43–77)
PLATELETS: 368 10*3/uL (ref 150–400)
RBC: 4.58 MIL/uL (ref 3.87–5.11)
RDW: 14.5 % (ref 11.5–15.5)
WBC: 6.1 10*3/uL (ref 4.0–10.5)

## 2015-05-23 LAB — COMPREHENSIVE METABOLIC PANEL
ALK PHOS: 63 U/L (ref 33–115)
ALT: 8 U/L (ref 6–29)
AST: 11 U/L (ref 10–30)
Albumin: 4.2 g/dL (ref 3.6–5.1)
BUN: 8 mg/dL (ref 7–25)
CALCIUM: 9.4 mg/dL (ref 8.6–10.2)
CHLORIDE: 103 mmol/L (ref 98–110)
CO2: 27 mmol/L (ref 20–31)
Creat: 1.08 mg/dL (ref 0.50–1.10)
Glucose, Bld: 91 mg/dL (ref 65–99)
Potassium: 3.8 mmol/L (ref 3.5–5.3)
Sodium: 140 mmol/L (ref 135–146)
TOTAL PROTEIN: 7.4 g/dL (ref 6.1–8.1)
Total Bilirubin: 0.6 mg/dL (ref 0.2–1.2)

## 2015-05-23 LAB — LIPID PANEL
CHOLESTEROL: 235 mg/dL — AB (ref 125–200)
HDL: 77 mg/dL (ref >=46)
LDL Cholesterol: 143 mg/dL — ABNORMAL HIGH (ref <130)
Total CHOL/HDL Ratio: 3.1 Ratio (ref <=5.0)
Triglycerides: 77 mg/dL (ref <150)
VLDL: 15 mg/dL (ref <30)

## 2015-05-23 LAB — AMYLASE: AMYLASE: 39 U/L (ref 0–105)

## 2015-05-23 MED ORDER — MEDROXYPROGESTERONE ACETATE 150 MG/ML IM SUSP: 150.0000 mg | INTRAMUSCULAR | Status: DC

## 2015-05-23 MED ORDER — ALPRAZOLAM 0.5 MG PO TABS: 0.5000 mg | ORAL_TABLET | Freq: Two times a day (BID) | ORAL | Status: DC

## 2015-05-23 MED ORDER — ONDANSETRON HCL 4 MG PO TABS: 4.0000 mg | ORAL_TABLET | Freq: Three times a day (TID) | ORAL | Status: DC | PRN

## 2015-05-23 MED ORDER — AMPHETAMINE-DEXTROAMPHETAMINE 20 MG PO TABS: 20.0000 mg | ORAL_TABLET | Freq: Three times a day (TID) | ORAL | Status: DC

## 2015-05-23 MED ORDER — BUPROPION HCL ER (XL) 150 MG PO TB24: 150.0000 mg | ORAL_TABLET | Freq: Two times a day (BID) | ORAL | Status: DC

## 2015-05-23 MED ORDER — MEDROXYPROGESTERONE ACETATE 150 MG/ML IM SUSP
150.0000 mg | Freq: Once | INTRAMUSCULAR | Status: AC
  Administered 2015-05-23: 150 mg via INTRAMUSCULAR

## 2015-05-23 NOTE — Progress Notes (Signed)
Subjective:    Patient ID: Anita Osborne, female    DOB: [birth year], 45 y.o.   MRN: [id number]  HPI Chief Complaint  Patient presents with  . Annual Exam  has several complaints  most importantly, she has been losing weight over the last 3-4 months with a decrease in appetite, early satiety, bloating , periumbilical abdominal pain intermittently  , vomiting episodically sometimes after eating.  She certainly continues with increased stress level from work and being a single parent has son graduating to go to college([employer]) and a son in kindergarten.    Because of that with some increase in depression symptoms,  She restarted Wellbutrin.  She has mainly just anxiety symptoms sometimes interfering with sleep.  no nocturnal GI pain, no reflux. Works for [employer].  Works [place]  Patient Active Problem List   Diagnosis Date Noted  . Right sacroiliac joint pain with radiation down the posterior thigh 09/20/2012  . Migraines 05/02/2012  . Depression with anxiety 12/25/2011  . ADHD (attention deficit hyperactivity disorder) 12/25/2011  . Asthma 08/16/2009    Review of Systems  Constitutional: Positive for fatigue and unexpected weight change. Negative for fever, chills and activity change.  HENT: Negative for dental problem and voice change.   Eyes: Negative for visual disturbance.  Respiratory: Negative for cough, chest tightness and shortness of breath.        No asthma symptoms  Cardiovascular: Negative for chest pain, palpitations and leg swelling.  Gastrointestinal: Negative for diarrhea, constipation and rectal pain.  Genitourinary: Negative for vaginal discharge and vaginal pain.       Continues to have very heavy periods Last Pel [place] indicated small fibr PAP 2014 ok Not currently sexually active  Musculoskeletal:       Musculoskeletal discomfort at times related to posture at work  Skin: Negative for color change and rash.  Neurological: Negative for  syncope, speech difficulty and light-headedness.       Migraines are now infrequent  Hematological: Negative for adenopathy. Does not bruise/bleed easily.  Psychiatric/Behavioral: Negative for confusion and self-injury.       ADD responding well to medication       Objective:   Physical Exam  Constitutional: She is oriented to person, place, and time. She appears well-developed and well-nourished. No distress.  HENT:  Head: Normocephalic.  Right Ear: External ear normal.  Left Ear: External ear normal.  Nose: Nose normal.  Mouth/Throat: Oropharynx is clear and moist.  Eyes: Conjunctivae and EOM are normal. Pupils are equal, round, and reactive to light.  Neck: Normal range of motion. Neck supple. No thyromegaly present.  Cardiovascular: Normal rate, regular rhythm, normal heart sounds and intact distal pulses.   No murmur heard. Pulmonary/Chest: Effort normal and breath sounds normal. She has no wheezes.  Breasts sym w/out masses  Abdominal: Soft. Bowel sounds are normal. She exhibits no distension and no mass. There is no tenderness. There is no rebound.  Mild epig tend w/out rebound or masses/o-meg  Musculoskeletal: Normal range of motion. She exhibits no edema or tenderness.  Lymphadenopathy:    She has no cervical adenopathy.  Neurological: She is alert and oriented to person, place, and time. She has normal reflexes. No cranial nerve deficit.  Skin: Skin is warm and dry. No rash noted.  Psychiatric: She has a normal mood and affect. Her behavior is normal. Judgment and thought content normal.  Nursing note and vitals reviewed.  Wt Readings from Last 3 Encounters:  05/23/15 149 lb (67.586 kg)  09/30/14 173 lb (78.472 kg)  09/27/14 174 lb (78.926 kg)      Assessment & Plan:  Annual physical exam - Plan: CBC with Differential/Platelet, Lipid panel  Asthma, mild intermittent, uncomplicated=stable  Depression with anxiety - Plan: Comprehensive metabolic panel,  TSH  Attention deficit hyperactivity disorder (ADHD), combined type  Other migraine without status migrainosus, not intractable==stable  Nausea and vomiting, intractability of vomiting not specified, unspecified vomiting type -  Abnormal weight loss --If labs are normal we will schedule an abdominal ultrasound and refer to gastroenterology saturation endoscopy and further evaluation Dysmenorrhea Menorrhagia with regular cycle ---medroxyPROGESTERone (DEPO-PROVERA) injection 150 mg---has resp to this in past/wants to restart   Other seasonal allergic rhinitis  Meds ordered this encounter  Medications  . amphetamine-dextroamphetamine (ADDERALL) 20 MG tablet    Sig: Take 1 tablet (20 mg total) by mouth 3 (three) times daily.    Dispense:  90 tablet    Refill:  0  . DISCONTD: amphetamine-dextroamphetamine (ADDERALL) 20 MG tablet    Sig: Take 1 tablet (20 mg total) by mouth 3 (three) times daily. For 60d after signed    Dispense:  90 tablet    Refill:  0  . DISCONTD: amphetamine-dextroamphetamine (ADDERALL) 20 MG tablet    Sig: Take 1 tablet (20 mg total) by mouth 3 (three) times daily. For 30d after signed    Dispense:  90 tablet    Refill:  0  . buPROPion (WELLBUTRIN XL) 150 MG 24 hr tablet    Sig: Take 1 tablet (150 mg total) by mouth 2 (two) times daily.    Dispense:  60 tablet    Refill:  11  . DISCONTD: ondansetron (ZOFRAN) 4 MG tablet    Sig: Take 1 tablet (4 mg total) by mouth every 8 (eight) hours as needed for nausea or vomiting.    Dispense:  30 tablet    Refill:  0  . medroxyPROGESTERone (DEPO-PROVERA) 150 MG/ML injection    Sig: Inject 1 mL (150 mg total) into the muscle every 3 (three) months.    Dispense:  1 mL    Refill:  3  . ALPRAZolam (XANAX) 0.5 MG tablet    Sig: Take 1 tablet (0.5 mg total) by mouth 2 (two) times daily.    Dispense:  60 tablet    Refill:  5    This request is for a new prescription for a controlled substance as required by Federal/State  law..  .     Addend--labs 2/24 Results for orders placed or performed in visit on 05/23/15  CBC with Differential/Platelet  Result Value Ref Range   WBC 6.1 4.0 - 10.5 K/uL   RBC 4.58 3.87 - 5.11 MIL/uL   Hemoglobin 12.4 12.0 - 15.0 g/dL   HCT 38.0 36.0 - 46.0 %   MCV 83.0 78.0 - 100.0 fL   MCH 27.1 26.0 - 34.0 pg   MCHC 32.6 30.0 - 36.0 g/dL   RDW 14.5 11.5 - 15.5 %   Platelets 368 150 - 400 K/uL   MPV 9.6 8.6 - 12.4 fL   Neutrophils Relative % 65 43 - 77 %   Neutro Abs 4.0 1.7 - 7.7 K/uL   Lymphocytes Relative 25 12 - 46 %   Lymphs Abs 1.5 0.7 - 4.0 K/uL   Monocytes Relative 7 3 - 12 %   Monocytes Absolute 0.4 0.1 - 1.0 K/uL   Eosinophils Relative 3 0 - 5 %  Eosinophils Absolute 0.2 0.0 - 0.7 K/uL   Basophils Relative 0 0 - 1 %   Basophils Absolute 0.0 0.0 - 0.1 K/uL   Smear Review Criteria for review not met   Comprehensive metabolic panel  Result Value Ref Range   Sodium 140 135 - 146 mmol/L   Potassium 3.8 3.5 - 5.3 mmol/L   Chloride 103 98 - 110 mmol/L   CO2 27 20 - 31 mmol/L   Glucose, Bld 91 65 - 99 mg/dL   BUN 8 7 - 25 mg/dL   Creat 1.08 0.50 - 1.10 mg/dL   Total Bilirubin 0.6 0.2 - 1.2 mg/dL   Alkaline Phosphatase 63 33 - 115 U/L   AST 11 10 - 30 U/L   ALT 8 6 - 29 U/L   Total Protein 7.4 6.1 - 8.1 g/dL   Albumin 4.2 3.6 - 5.1 g/dL   Calcium 9.4 8.6 - 10.2 mg/dL  TSH  Result Value Ref Range   TSH 1.03 mIU/L  Lipid panel  Result Value Ref Range   Cholesterol 235 (H) 125 - 200 mg/dL   Triglycerides 77 <150 mg/dL   HDL 77 >=46 mg/dL   Total CHOL/HDL Ratio 3.1 <=5.0 Ratio   VLDL 15 <30 mg/dL   LDL Cholesterol 143 (H) <130 mg/dL  Amylase  Result Value Ref Range   Amylase 39 0 - 105 U/L

## 2015-05-24 LAB — TSH: TSH: 1.03 mIU/L

## 2015-05-25 ENCOUNTER — Encounter: Payer: Self-pay | Admitting: *Deleted

## 2015-05-25 ENCOUNTER — Emergency Department (INDEPENDENT_AMBULATORY_CARE_PROVIDER_SITE_OTHER)
Admission: EM | Admit: 2015-05-25 | Discharge: 2015-05-25 | Disposition: A | Discharge status: Home / Self Care | Payer: BLUE CROSS/BLUE SHIELD | Source: Home / Self Care | Attending: Family Medicine | Admitting: Family Medicine

## 2015-05-25 DIAGNOSIS — L03113 Cellulitis of right upper limb: Secondary | ICD-10-CM | POA: Diagnosis not present

## 2015-05-25 MED ORDER — FLUCONAZOLE 150 MG PO TABS
150.0000 mg | ORAL_TABLET | Freq: Once | ORAL | Status: DC
Start: 2015-05-25 — End: 2015-07-04

## 2015-05-25 MED ORDER — HYDROXYZINE HCL 25 MG PO TABS: 25.0000 mg | ORAL_TABLET | Freq: Four times a day (QID) | ORAL | Status: DC | PRN

## 2015-05-25 MED ORDER — DEXAMETHASONE SODIUM PHOSPHATE 10 MG/ML IJ SOLN
10.0000 mg | Freq: Once | INTRAMUSCULAR | Status: AC
  Administered 2015-05-25: 10 mg via INTRAMUSCULAR

## 2015-05-25 MED ORDER — IBUPROFEN 600 MG PO TABS: 600.0000 mg | ORAL_TABLET | Freq: Four times a day (QID) | ORAL | Status: DC | PRN

## 2015-05-25 MED ORDER — CLINDAMYCIN HCL 300 MG PO CAPS: 300.0000 mg | ORAL_CAPSULE | Freq: Three times a day (TID) | ORAL | Status: DC

## 2015-05-25 MED ORDER — PREDNISONE 20 MG PO TABS: ORAL_TABLET | ORAL | Status: DC

## 2015-05-25 NOTE — ED Notes (Addendum)
Pt c/o right arm redness and swelling with itching since yesterday. Arm is getting painful. Taken Benadryl without relief.

## 2015-05-25 NOTE — ED Provider Notes (Signed)
CSN: [id number]     Arrival date & time 05/25/15  4098 History   First MD Initiated Contact with Patient 05/25/15 0840     Chief Complaint  Patient presents with  . Arm Swelling   (Consider location/radiation/quality/duration/timing/severity/associated sxs/prior Treatment) HPI The pt is a 45yo female presenting to [facility] with c/o Right arm pain, redness, swelling, and itching that started yesterday.  She first noticed the redness on her forearm but then also noticed a reddened area on the medial aspect of her Right elbow.  She denies known insect bites. No known exposure to known allergens.  She did try ibuprofen and benadryl but no relief.  Denies fever, chills, n/v/d. Denies throat swelling or difficulty breathing.   Past Medical History  Diagnosis Date  . Asthma   . Abnormal Pap smear 2008  . Depression   . Anxiety   . Genital herpes   . Migraine   . Allergy   . Hyperlipidemia   . ADD (attention deficit disorder)    Past Surgical History  Procedure Laterality Date  . Tubal ligation  04/06/10  . Eye surgery     Family History  Problem Relation Age of Onset  . Heart disease Maternal Grandmother   . Diabetes type I Maternal Grandmother   . Hypertension Maternal Grandmother   . Heart disease Paternal Grandmother   . Asthma Brother   . Asthma Daughter   . Diabetes Mother   . Hypertension Father    Social History  Substance Use Topics  . Smoking status: Never Smoker   . Smokeless tobacco: Never Used  . Alcohol Use: Yes   OB History    Gravida Para Term Preterm AB TAB SAB Ectopic Multiple Living   Review of Systems  Constitutional: Negative for fever and chills.  Gastrointestinal: Negative for nausea, vomiting and diarrhea.  Musculoskeletal: Positive for myalgias and joint swelling. Negative for arthralgias.  Skin: Positive for color change and rash. Negative for wound.  Neurological: Negative for weakness and numbness.    Allergies  Sulfonamide  derivatives; Tramadol; Codeine; and Percocet  Home Medications   Prior to Admission medications   Medication Sig Start Date End Date Taking? Authorizing Provider  ALPRAZolam Prudy Feeler) 0.5 MG tablet Take 1 tablet (0.5 mg total) by mouth 2 (two) times daily. 05/23/15  Yes Tonye Pearson, MD  amphetamine-dextroamphetamine (ADDERALL) 20 MG tablet Take 1 tablet (20 mg total) by mouth 3 (three) times daily. 05/23/15  Yes Tonye Pearson, MD  buPROPion (WELLBUTRIN XL) 150 MG 24 hr tablet Take 1 tablet (150 mg total) by mouth 2 (two) times daily. 05/23/15  Yes Tonye Pearson, MD  fexofenadine (ALLEGRA) 30 MG tablet Take 30 mg by mouth 2 (two) times daily.   Yes Historical Provider, MD  ondansetron (ZOFRAN) 4 MG tablet Take 4 mg by mouth 2 (two) times daily.   Yes Historical Provider, MD  clindamycin (CLEOCIN) 300 MG capsule Take 1 capsule (300 mg total) by mouth 3 (three) times daily. X 7 days 05/25/15   Junius Finner, PA-C  fluconazole (DIFLUCAN) 150 MG tablet Take 1 tablet (150 mg total) by mouth once. Per episode of yeast infection. May take again in 3 days if not improving. 05/25/15   Junius Finner, PA-C  hydrOXYzine (ATARAX/VISTARIL) 25 MG tablet Take 1 tablet (25 mg total) by mouth every 6 (six) hours as needed for itching. 05/25/15   Junius Finner, PA-C  ibuprofen (ADVIL,MOTRIN) 600 MG tablet Take 1 tablet (600 mg total) by mouth every 6 (six) hours as needed for moderate pain. 05/25/15   Junius Finner, PA-C  medroxyPROGESTERone (DEPO-PROVERA) 150 MG/ML injection Inject 1 mL (150 mg total) into the muscle every 3 (three) months. 05/23/15   Tonye Pearson, MD  predniSONE (DELTASONE) 20 MG tablet 2 tabs po daily x 3 days 05/25/15   Junius Finner, PA-C   Meds Ordered and Administered this Visit   Medications  dexamethasone (DECADRON) injection 10 mg (10 mg Intramuscular Given 05/25/15 0851)    BP 111/79 mmHg  Pulse 96  Temp(Src) 98.1 F (36.7 C) (Oral)  Wt 152 lb (68.947 kg)  SpO2 100%   LMP 05/22/2015 No data found.   Physical Exam  Constitutional: She is oriented to person, place, and time. She appears well-developed and well-nourished.  HENT:  Head: Normocephalic and atraumatic.  Mouth/Throat: Oropharynx is clear and moist.  No oral swelling  Eyes: EOM are normal.  Neck: Normal range of motion.  Cardiovascular: Normal rate.   Pulses:      Radial pulses are 2+ on the right side.  Pulmonary/Chest: Effort normal. No stridor. No respiratory distress.  Musculoskeletal: Normal range of motion. She exhibits edema and tenderness.  Right arm: full ROM, mild to moderate edema of forearm and around medial aspect of Right elbow (see skin exam)  Neurological: She is alert and oriented to person, place, and time.  Skin: Skin is warm and dry. Rash noted. There is erythema.  Right forearm: mild to moderate edema with erythema and warmth on volar aspect of arm. 1cm area of excoriation or abrasion.   Right elbow, medial aspect: 4cm area of erythema and warmth. No induration or fluctuance. No bleeding or discharge.   Psychiatric: She has a normal mood and affect. Her behavior is normal.  Nursing note and vitals reviewed.   ED Course  Procedures (including critical care time)  Labs Review Labs Reviewed - No data to display  Imaging Review No results found.    MDM   1. Cellulitis of right arm    Pt presenting to [facility] with Right arm redness, swelling, itching and pain.  Exam c/w cellulitis with underlying localized allergic reaction. No evidence of anaphylaxis.   Tx in UC: Decadron  IM   Rx: Clindamycin, prednisone, atarax, ibuprofen, and diflucan (pt states she "always" gets a yeast infection when on antibiotics)  Encouraged to f/u in 3-4 days if not improving, sooner if worsening. Patient verbalized understanding and agreement with treatment plan.     Junius Finner, PA-C 05/25/15 [phone]

## 2015-05-25 NOTE — Discharge Instructions (Signed)
You were given a shot of decadron (a steroid) today to help with itching and swelling from a likely allergic reaction.  You have been prescribed 3 days of prednisone, an oral steroid.  You maystart this medication tomorrow with breakfast.    Please take antibiotics as prescribed and be sure to complete entire course even if you start to feel better to ensure infection does not come back.  Cellulitis Cellulitis is an infection of the skin and the tissue under the skin. The infected area is usually red and tender. This happens most often in the arms and lower legs. HOME CARE   Take your antibiotic medicine as told. Finish the medicine even if you start to feel better.  Keep the infected arm or leg raised (elevated).  Put a warm cloth on the area up to 4 times per day.  Only take medicines as told by your doctor.  Keep all doctor visits as told. GET HELP IF:  You see red streaks on the skin coming from the infected area.  Your red area gets bigger or turns a dark color.  Your bone or joint under the infected area is painful after the skin heals.  Your infection comes back in the same area or different area.  You have a puffy (swollen) bump in the infected area.  You have new symptoms.  You have a fever. GET HELP RIGHT AWAY IF:   You feel very sleepy.  You throw up (vomit) or have watery poop (diarrhea).  You feel sick and have muscle aches and pains.   This information is not intended to replace advice given to you by your health care provider. Make sure you discuss any questions you have with your health care provider.   Document Released: 09/03/2007 Document Revised: 12/06/2014 Document Reviewed: 06/02/2011 Elsevier Interactive Patient Education [employer]

## 2015-05-28 ENCOUNTER — Encounter: Payer: Self-pay | Admitting: Physician Assistant

## 2015-05-29 ENCOUNTER — Telehealth: Payer: Self-pay | Admitting: *Deleted

## 2015-06-04 ENCOUNTER — Encounter: Payer: Self-pay | Admitting: Physician Assistant

## 2015-06-04 ENCOUNTER — Ambulatory Visit (INDEPENDENT_AMBULATORY_CARE_PROVIDER_SITE_OTHER): Payer: BLUE CROSS/BLUE SHIELD | Admitting: Physician Assistant

## 2015-06-04 VITALS — BP 118/70 | HR 72 | Ht 65.0 in | Wt 155.0 lb

## 2015-06-04 DIAGNOSIS — R634 Abnormal weight loss: Secondary | ICD-10-CM | POA: Diagnosis not present

## 2015-06-04 DIAGNOSIS — R110 Nausea: Secondary | ICD-10-CM

## 2015-06-04 MED ORDER — ONDANSETRON HCL 4 MG PO TABS: ORAL_TABLET | ORAL | Status: DC

## 2015-06-04 MED ORDER — PANTOPRAZOLE SODIUM 40 MG PO TBEC: DELAYED_RELEASE_TABLET | ORAL | Status: DC

## 2015-06-04 NOTE — Patient Instructions (Signed)
We sent prescriptions to [facility] [address], [place]. 1. Pantoprazole sodium 40 mg. 2. Zofran 4 mg  You have been scheduled for an endoscopy. Please follow written instructions given to you at your visit today. If you use inhalers (even only as needed), please bring them with you on the day of your procedure. Your physician has requested that you go to www.startemmi.com and enter the access code given to you at your visit today. This web site gives a general overview about your procedure. However, you should still follow specific instructions given to you by our office regarding your preparation for the procedure.

## 2015-06-04 NOTE — Progress Notes (Signed)
Patient ID: Anita Osborne, female   DOB: [birth year], 45 y.o.   MRN: [id number]   Subjective:    Patient ID: Anita Osborne, female    DOB: 1970/08/24, 45 y.o.   MRN: [id number]  HPI  Anita Osborne is a pleasant 45 year old African-American female new to GI. She is referred by Dr. Merla Riches. Appendectomy she has no prior GI history. She does have history of migraine headaches, depression, and ADHD. She says over the past 2 years she has had episodes of nausea. One point she was taking tramadol for her sciatica which caused a lot of nausea, this was discontinued that she continued to have spells of nausea. Over the past few months she developed a complete lack of appetite and lost about 20 pounds. She says she's not had any abdominal pain. She does have some early satiety type symptoms. No heartburn indigestion or dysphagia. His not been on any new medications or had any changes in her dosages. Upper abdominal ultrasound that was scheduled by her PCP for tomorrow. Patient did develop a right upper extremity cellulitis a couple of weeks ago and took Cleocin for a very short period of time which gave her horrible diarrhea. She stopped the Cleocin and the diarrhea resolved and has not returned. CBC, CMET,and TSH were recently done and unremarkable  Review of Systems Pertinent positive and negative review of systems were noted in the above HPI section.  All other review of systems was otherwise negative.  Outpatient Encounter Prescriptions as of 06/04/2015  Medication Sig  . ALPRAZolam (XANAX) 0.5 MG tablet Take 1 tablet (0.5 mg total) by mouth 2 (two) times daily.  Marland Kitchen amphetamine-dextroamphetamine (ADDERALL) 20 MG tablet Take 1 tablet (20 mg total) by mouth 3 (three) times daily.  Marland Kitchen buPROPion (WELLBUTRIN XL) 150 MG 24 hr tablet Take 1 tablet (150 mg total) by mouth 2 (two) times daily.  . fexofenadine (ALLEGRA) 30 MG tablet Take 30 mg by mouth 2 (two) times daily.  . fluconazole (DIFLUCAN) 150 MG tablet  Take 1 tablet (150 mg total) by mouth once. Per episode of yeast infection. May take again in 3 days if not improving.  Marland Kitchen ibuprofen (ADVIL,MOTRIN) 600 MG tablet Take 1 tablet (600 mg total) by mouth every 6 (six) hours as needed for moderate pain.  . medroxyPROGESTERone (DEPO-PROVERA) 150 MG/ML injection Inject 1 mL (150 mg total) into the muscle every 3 (three) months.  . ondansetron (ZOFRAN) 4 MG tablet Take 1 tab every 6 hours as needed for nausea.  . pantoprazole (PROTONIX) 40 MG tablet Take 1 tab by mouth every morning.  . [DISCONTINUED] clindamycin (CLEOCIN) 300 MG capsule Take 1 capsule (300 mg total) by mouth 3 (three) times daily. X 7 days  . [DISCONTINUED] hydrOXYzine (ATARAX/VISTARIL) 25 MG tablet Take 1 tablet (25 mg total) by mouth every 6 (six) hours as needed for itching.  . [DISCONTINUED] ondansetron (ZOFRAN) 4 MG tablet Take 4 mg by mouth 2 (two) times daily.  . [DISCONTINUED] predniSONE (DELTASONE) 20 MG tablet 2 tabs po daily x 3 days   No facility-administered encounter medications on file as of 06/04/2015.   Allergies  Allergen Reactions  . Sulfonamide Derivatives   . Tramadol   . Codeine Itching  . Percocet [Oxycodone-Acetaminophen] Itching   Patient Active Problem List   Diagnosis Date Noted  . Right sacroiliac joint pain with radiation down the posterior thigh 09/20/2012  . Migraines 05/02/2012  . Depression with anxiety 12/25/2011  . ADHD (attention deficit hyperactivity disorder) 12/25/2011  .  Asthma 08/16/2009   Social History   Social History  . Marital Status: Divorced    Spouse Name: N/A  . Number of Children: N/A  . Years of Education: N/A   Occupational History  . Not on file.   Social History Main Topics  . Smoking status: Never Smoker   . Smokeless tobacco: Never Used  . Alcohol Use: Yes  . Drug Use: No  . Sexual Activity:    Partners: Male    Birth Control/ Protection: None   Other Topics Concern  . Not on file   Social History  Narrative    [name]'s family history includes Asthma in her brother and daughter; Diabetes in her mother; Diabetes type I in her maternal grandmother; Heart disease in her maternal grandmother and paternal grandmother; Hypertension in her father and maternal grandmother.      Objective:    Filed Vitals:   06/04/15 1045  BP: 118/70  Pulse: 72    Physical Exam  well-developed young African-American female in no acute distress, quite pleasant blood pressure 118/70 pulse 72 height 5 foot 5 weight 155. HEENT; nontraumatic, cephalic EOMI PERRLA sclera anicteric, Cardiovascular; regular rate and rhythm with S1-S2 no murmur or gallop, Pulmonary; clear bilaterally, Abdomen ;soft basically nontender there is no palpable mass or hepatosplenomegaly bowel sounds are present, Rectal; exam not done, Extremities ;no clubbing cyanosis or edema skin warm and dry, Neuropsych; mood and affect appropriate     Assessment & Plan:   #1 45 yo female with several month hx of intermittent nausea, lack of appetite and 20 pound weight loss Etiology not clear- r/o GB disease, r/o gastropathy , gastroparesis, occult neoplasm #2 hx Migraines #3 ADHD #4 Depression  Plan; Will follow up on an upper abdominal ultrasound which is ordered for tomorrow Schedule for EGD with Dr. Christella HartiganJacobs. Procedure was discussed in detail with patient and she is agreeable to proceed. If ultrasound is unrevealing will consider CT of the abdomen and pelvis. Continue Zofran 4-8 mg every 6 hours when necessary for nausea-have suggested she take 8 mg in the morning regular basis. Add Protonix 40 mg by mouth every morning.   Amy S Esterwood PA-C 06/04/2015   Cc: Tonye Pearsonoolittle, Robert P, MD

## 2015-06-04 NOTE — Progress Notes (Signed)
i agree with the above note, plan 

## 2015-06-05 ENCOUNTER — Ambulatory Visit
Admission: RE | Admit: 2015-06-05 | Discharge: 2015-06-05 | Disposition: A | Discharge status: Home / Self Care | Payer: BLUE CROSS/BLUE SHIELD | Source: Ambulatory Visit | Attending: Internal Medicine | Admitting: Internal Medicine

## 2015-06-05 DIAGNOSIS — R634 Abnormal weight loss: Secondary | ICD-10-CM

## 2015-06-05 DIAGNOSIS — R112 Nausea with vomiting, unspecified: Secondary | ICD-10-CM

## 2015-07-04 ENCOUNTER — Ambulatory Visit (INDEPENDENT_AMBULATORY_CARE_PROVIDER_SITE_OTHER): Payer: BLUE CROSS/BLUE SHIELD | Admitting: Internal Medicine

## 2015-07-04 ENCOUNTER — Encounter: Payer: Self-pay | Admitting: Internal Medicine

## 2015-07-04 VITALS — BP 124/79 | HR 99 | Temp 98.4°F | Resp 16 | Ht 65.0 in | Wt 149.0 lb

## 2015-07-04 DIAGNOSIS — R634 Abnormal weight loss: Secondary | ICD-10-CM

## 2015-07-04 DIAGNOSIS — R112 Nausea with vomiting, unspecified: Secondary | ICD-10-CM | POA: Diagnosis not present

## 2015-07-04 DIAGNOSIS — L700 Acne vulgaris: Secondary | ICD-10-CM | POA: Diagnosis not present

## 2015-07-04 DIAGNOSIS — F902 Attention-deficit hyperactivity disorder, combined type: Secondary | ICD-10-CM | POA: Diagnosis not present

## 2015-07-04 MED ORDER — AMPHETAMINE-DEXTROAMPHETAMINE 20 MG PO TABS: 20.0000 mg | ORAL_TABLET | Freq: Three times a day (TID) | ORAL | Status: DC

## 2015-07-04 NOTE — Progress Notes (Signed)
   Subjective:    Patient ID: Anita Osborne, female    DOB: 1970-11-18, 45 y.o.   MRN: [id number]  HPI  Chief Complaint  Patient presents with  . Follow-up    stomach issues  . Medication Refill  . Referral for derm issues   Patient Active Problem List   Diagnosis Date Noted  . Right sacroiliac joint pain with radiation down the posterior thigh 09/20/2012  . Migraines--infrequent  05/02/2012  . Depression with anxiety--stable  12/25/2011  . ADHD (attention deficit hyperactivity disorder)---Doing well and needs refills  12/25/2011  . Asthma--Stable  08/16/2009   Trouble with abdominal pain currently being evaluated by gastroenterology with plans for endoscopy soon. As noted in the chart this is unpredictable but is causing missed work at least 2 days a week. Negative metabolic profile. Negative abdominal ultrasound. She feels like she has continued to lose weight.  Acne facial, developed after initial shot of depo Provera--in February--- better now that she would like a dermatology evaluation to which she can do as she would like to continue Depo-Provera  Review of Systems Noncontributory    Objective:   Physical Exam  Constitutional: She is oriented to person, place, and time. She appears well-developed and well-nourished. No distress.  HENT:  Head: Normocephalic and atraumatic.  Eyes: Pupils are equal, round, and reactive to light.  Neck: Normal range of motion.  Cardiovascular: Normal rate and regular rhythm.   Pulmonary/Chest: Effort normal. No respiratory distress.  Musculoskeletal: Normal range of motion.  Neurological: She is alert and oriented to person, place, and time.  Skin: Skin is warm and dry.  Mild comedonal acne on the face  Psychiatric: She has a normal mood and affect. Her behavior is normal.  Nursing note and vitals reviewed. BP 124/79 mmHg  Pulse 99  Temp(Src) 98.4 F (36.9 C)  Resp 16  Ht 5\' 5"  (1.651 m)  Wt 149 lb (67.586 kg)  BMI 24.79  kg/m2  Wt Readings from Last 3 Encounters:  07/04/15 149 lb (67.586 kg)  06/04/15 155 lb (70.308 kg)  05/25/15 152 lb (68.947 kg)      Assessment & Plan:  Acne vulgaris - Plan: Ambulatory referral to Dermatology  Attention deficit hyperactivity disorder (ADHD), combined type  Nausea and vomiting, intractability of vomiting not specified, unspecified vomiting type  Abnormal weight loss  Meds ordered this encounter  Medications  . amphetamine-dextroamphetamine (ADDERALL) 20 MG tablet    Sig: Take 1 tablet (20 mg total) by mouth 3 (three) times daily. To follow April 2017 rx    Dispense:  90 tablet    Refill:  0  . amphetamine-dextroamphetamine (ADDERALL) 20 MG tablet    Sig: Take 1 tablet (20 mg total) by mouth 3 (three) times daily. To follow may 2017 rx    Dispense:  90 tablet    Refill:  0  . amphetamine-dextroamphetamine (ADDERALL) 20 MG tablet    Sig: Take 1 tablet (20 mg total) by mouth 3 (three) times daily. To follow June 2017 rx    Dispense:  90 tablet    Refill:  0   F/u options given//call 3 and f/u 6mos

## 2015-07-04 NOTE — Patient Instructions (Signed)
     IF you received an x-ray today, you will receive an invoice from [facility]. Please contact [facility] at [phone] with questions or concerns regarding your invoice.   IF you received labwork today, you will receive an invoice from [facility]. Please contact [facility] at [phone] with questions or concerns regarding your invoice.   Our billing staff will not be able to assist you with questions regarding bills from these companies.  You will be contacted with the lab results as soon as they are available. The fastest way to get your results is to activate your My Chart account. Instructions are located on the last page of this paperwork. If you have not heard from us regarding the results in 2 weeks, please contact this office.      

## 2015-07-20 ENCOUNTER — Ambulatory Visit (AMBULATORY_SURGERY_CENTER): Payer: BLUE CROSS/BLUE SHIELD | Admitting: Gastroenterology

## 2015-07-20 ENCOUNTER — Encounter: Payer: Self-pay | Admitting: Gastroenterology

## 2015-07-20 VITALS — BP 123/65 | HR 79 | Temp 98.7°F | Resp 19 | Ht 65.0 in | Wt 155.0 lb

## 2015-07-20 DIAGNOSIS — R634 Abnormal weight loss: Secondary | ICD-10-CM

## 2015-07-20 DIAGNOSIS — K2970 Gastritis, unspecified, without bleeding: Secondary | ICD-10-CM | POA: Diagnosis not present

## 2015-07-20 DIAGNOSIS — K2990 Gastroduodenitis, unspecified, without bleeding: Secondary | ICD-10-CM | POA: Diagnosis not present

## 2015-07-20 DIAGNOSIS — K2950 Unspecified chronic gastritis without bleeding: Secondary | ICD-10-CM | POA: Diagnosis not present

## 2015-07-20 MED ORDER — SODIUM CHLORIDE 0.9 % IV SOLN: 500.0000 mL | INTRAVENOUS | Status: DC

## 2015-07-20 NOTE — Progress Notes (Signed)
Report to PACU, RN, vss, BBS= Clear.  

## 2015-07-20 NOTE — Patient Instructions (Signed)
Discharge instructions given. Biopsies taken. Resume previous medications. YOU HAD AN ENDOSCOPIC PROCEDURE TODAY AT THE [facility]:   Refer to the procedure report that was given to you for any specific questions about what was found during the examination.  If the procedure report does not answer your questions, please call your gastroenterologist to clarify.  If you requested that your care partner not be given the details of your procedure findings, then the procedure report has been included in a sealed envelope for you to review at your convenience later.  YOU SHOULD EXPECT: Some feelings of bloating in the abdomen. Passage of more gas than usual.  Walking can help get rid of the air that was put into your GI tract during the procedure and reduce the bloating. If you had a lower endoscopy (such as a colonoscopy or flexible sigmoidoscopy) you may notice spotting of blood in your stool or on the toilet paper. If you underwent a bowel prep for your procedure, you may not have a normal bowel movement for a few days.  Please Note:  You might notice some irritation and congestion in your nose or some drainage.  This is from the oxygen used during your procedure.  There is no need for concern and it should clear up in a day or so.  SYMPTOMS TO REPORT IMMEDIATELY:   Following upper endoscopy (EGD)  Vomiting of blood or coffee ground material  New chest pain or pain under the shoulder blades  Painful or persistently difficult swallowing  New shortness of breath  Fever of 100F or higher  Black, tarry-looking stools  For urgent or emergent issues, a gastroenterologist can be reached at any hour by calling [phone].   DIET: Your first meal following the procedure should be a small meal and then it is ok to progress to your normal diet. Heavy or fried foods are harder to digest and may make you feel nauseous or bloated.  Likewise, meals heavy in dairy and vegetables can increase  bloating.  Drink plenty of fluids but you should avoid alcoholic beverages for 24 hours.  ACTIVITY:  You should plan to take it easy for the rest of today and you should NOT DRIVE or use heavy machinery until tomorrow (because of the sedation medicines used during the test).    FOLLOW UP: Our staff will call the number listed on your records the next business day following your procedure to check on you and address any questions or concerns that you may have regarding the information given to you following your procedure. If we do not reach you, we will leave a message.  However, if you are feeling well and you are not experiencing any problems, there is no need to return our call.  We will assume that you have returned to your regular daily activities without incident.  If any biopsies were taken you will be contacted by phone or by letter within the next 1-3 weeks.  Please call us at [phone] if you have not heard about the biopsies in 3 weeks.    SIGNATURES/CONFIDENTIALITY: You and/or your care partner have signed paperwork which will be entered into your electronic medical record.  These signatures attest to the fact that that the information above on your After Visit Summary has been reviewed and is understood.  Full responsibility of the confidentiality of this discharge information lies with you and/or your care-partner. 

## 2015-07-20 NOTE — Op Note (Signed)
[facility] Patient Name: Anita Osborne Procedure Date: 07/20/2015 1:53 PM MRN: [id number] Endoscopist: Rachael Feeaniel P Jacobs , MD Age: 45 Date of Birth: 06-01-1970 Gender: Female Procedure:                Upper GI endoscopy Indications:              Nausea, Weight loss Medicines:                Monitored Anesthesia Care Procedure:                Pre-Anesthesia Assessment:                           - Prior to the procedure, a History and Physical                            was performed, and patient medications and                            allergies were reviewed. The patient's tolerance of                            previous anesthesia was also reviewed. The risks                            and benefits of the procedure and the sedation                            options and risks were discussed with the patient.                            All questions were answered, and informed consent                            was obtained. Prior Anticoagulants: The patient has                            taken no previous anticoagulant or antiplatelet                            agents. ASA Grade Assessment: II - A patient with                            mild systemic disease. After reviewing the risks                            and benefits, the patient was deemed in                            satisfactory condition to undergo the procedure.                           After obtaining informed consent, the endoscope was  passed under direct vision. Throughout the                            procedure, the patient's blood pressure, pulse, and                            oxygen saturations were monitored continuously. The                            Model GIF-[id number]) scope was introduced                            through the mouth, and advanced to the second part                            of duodenum. The upper GI endoscopy was   accomplished without difficulty. The patient                            tolerated the procedure well. Scope In: Scope Out: Findings:                 Segmental moderate inflammation characterized by                            friability and granularity was found in the gastric                            body. Biopsies were taken with a cold forceps for                            histology.                           The exam was otherwise without abnormality. Complications:            No immediate complications. Estimated blood loss:                            None. Estimated Blood Loss:     Estimated blood loss: none. Impression:               - Gastritis. Biopsied.                           - The examination was otherwise normal. Recommendation:           - Patient has a contact number available for                            emergencies. The signs and symptoms of potential                            delayed complications were discussed with the                            patient. Return to normal activities tomorrow.  Written discharge instructions were provided to the                            patient.                           - Resume previous diet.                           - Continue present medications.                           - Await pathology results. If these are not helpful                            then will likely arrange further testing (CT scan                            abd/pelvis) Rachael Fee, MD 07/20/2015 2:10:29 PM This report has been signed electronically.

## 2015-07-20 NOTE — Progress Notes (Signed)
Called to room to assist during endoscopic procedure.  Patient ID and intended procedure confirmed with present staff. Received instructions for my participation in the procedure from the performing physician.  

## 2015-07-23 ENCOUNTER — Telehealth: Payer: Self-pay

## 2015-07-23 NOTE — Telephone Encounter (Signed)
  Follow up Call-  Call back number 07/20/2015  Post procedure Call Back phone  # [phone]  Permission to leave phone message Yes     Patient questions:  Do you have a fever, pain , or abdominal swelling? No. Pain Score  0 *  Have you tolerated food without any problems? Yes.    Have you been able to return to your normal activities? Yes.    Do you have any questions about your discharge instructions: Diet   No. Medications  No. Follow up visit  No.  Do you have questions or concerns about your Care? No.  Actions: * If pain score is 4 or above: No action needed, pain <4.

## 2015-07-26 ENCOUNTER — Encounter: Payer: Self-pay | Admitting: *Deleted

## 2015-07-26 ENCOUNTER — Other Ambulatory Visit: Payer: Self-pay | Admitting: *Deleted

## 2015-07-26 DIAGNOSIS — R634 Abnormal weight loss: Secondary | ICD-10-CM

## 2015-07-26 DIAGNOSIS — R110 Nausea: Secondary | ICD-10-CM

## 2015-07-31 ENCOUNTER — Inpatient Hospital Stay: Admission: RE | Admit: 2015-07-31 | Payer: BLUE CROSS/BLUE SHIELD | Source: Ambulatory Visit

## 2016-01-05 ENCOUNTER — Ambulatory Visit: Payer: BLUE CROSS/BLUE SHIELD

## 2016-01-12 ENCOUNTER — Encounter: Payer: Self-pay | Admitting: Urgent Care

## 2016-01-12 ENCOUNTER — Ambulatory Visit (INDEPENDENT_AMBULATORY_CARE_PROVIDER_SITE_OTHER): Payer: BLUE CROSS/BLUE SHIELD | Admitting: Family Medicine

## 2016-01-12 VITALS — BP 120/70 | HR 105 | Temp 98.1°F | Resp 18 | Ht 65.0 in | Wt 153.0 lb

## 2016-01-12 DIAGNOSIS — F902 Attention-deficit hyperactivity disorder, combined type: Secondary | ICD-10-CM

## 2016-01-12 MED ORDER — AMPHETAMINE-DEXTROAMPHETAMINE 20 MG PO TABS: 20.0000 mg | ORAL_TABLET | Freq: Three times a day (TID) | ORAL | 0 refills | Status: AC

## 2016-01-12 NOTE — Progress Notes (Deleted)
   Subjective:    Patient ID: Anita Osborne, female    DOB: 10/04/1970, 45 y.o.   MRN: [id number]  HPI    Review of Systems     Objective:   Physical Exam        Assessment & Plan:

## 2016-01-12 NOTE — Progress Notes (Addendum)
Subjective:  By signing my name below, I, Anita Osborne, attest that this documentation has been prepared under the direction and in the presence of Anita SorensonEva Jahron Hunsinger, MD. Electronically Signed: Stann Oresung-Kai Osborne, Scribe. 01/12/2016 , 8:36 AM .  Patient was seen in Room 3 .   Patient ID: Anita Osborne, female    DOB: 31-Aug-1970, 45 y.o.   MRN: [id number] Chief Complaint  Patient presents with  . Medication Refill    ADDERALL   HPI Anita Sellsndrea D Easterwood is a 45 y.o. female who [facility] requesting refill of her adderall medication. She is a former patient of Dr. Netta Corriganoolittle's. She has history of ADD. She mentioned lack of concentration and short term memory to Dr. Merla Richesoolittle, and was suggested to take ADD testing.   She had a prescription but wasn't able to fill it because it was a day over 6 months. She has an appointment with Dr. Elisabeth MostStevenson at [facility], but not until the end of the month. She believed she could last until then but she is having trouble going to sleep because not getting things done at night and work.   She has 3 children at home: 43 year old, 43 year old, and 43 year old.  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   Past Medical History:  Diagnosis Date  . Abnormal Pap smear 2008  . ADD (attention deficit disorder)   . Allergy   . Anxiety   . Asthma   . Depression   . Genital herpes   . Hyperlipidemia   . Migraine    Prior to Admission medications   Medication Sig Start Date End Date Taking? Authorizing Provider  ALPRAZolam Prudy Feeler(XANAX) 0.5 MG tablet Take 1 tablet (0.5 mg total) by mouth 2 (two) times daily. 05/23/15  Yes Tonye Pearsonobert P Doolittle, MD  amphetamine-dextroamphetamine (ADDERALL) 20 MG tablet Take 1 tablet (20 mg total) by mouth 3 (three) times daily. To follow April 2017 rx 07/04/15  Yes Tonye Pearsonobert P Doolittle, MD  buPROPion (WELLBUTRIN XL) 150 MG 24 hr tablet Take 1 tablet (150 mg total) by mouth 2 (two) times daily. 05/23/15  Yes Tonye Pearsonobert P Doolittle,  MD  fexofenadine (ALLEGRA) 30 MG tablet Take 30 mg by mouth 2 (two) times daily.   Yes Historical Provider, MD  medroxyPROGESTERone (DEPO-PROVERA) 150 MG/ML injection Inject 1 mL (150 mg total) into the muscle every 3 (three) months. 05/23/15  Yes Tonye Pearsonobert P Doolittle, MD  ondansetron (ZOFRAN) 4 MG tablet Take 1 tab every 6 hours as needed for nausea. 06/04/15  Yes Amy S Esterwood, PA-C  pantoprazole (PROTONIX) 40 MG tablet Take 1 tab by mouth every morning. 06/04/15  Yes Amy S Esterwood, PA-C   Allergies  Allergen Reactions  . Sulfonamide Derivatives   . Tramadol   . Codeine Itching  . Percocet [Oxycodone-Acetaminophen] Itching   Review of Systems  Constitutional: Negative for chills, fatigue, fever and unexpected weight change.  Respiratory: Negative for cough.   Gastrointestinal: Negative for constipation, diarrhea, nausea and vomiting.  Skin: Negative for rash and wound.  Neurological: Negative for dizziness, weakness and headaches.  Psychiatric/Behavioral: Positive for decreased concentration and sleep disturbance.       Objective:   Physical Exam  Constitutional: She is oriented to person, place, and time. She appears well-developed and well-nourished. No distress.  HENT:  Head: Normocephalic and atraumatic.  Eyes: EOM are normal. Pupils are equal, round, and reactive to light.  Neck: Neck supple.  Cardiovascular: Normal rate, regular  rhythm and normal heart sounds.  Exam reveals no gallop and no friction rub.   No murmur heard. Pulmonary/Chest: Effort normal and breath sounds normal. No respiratory distress. She has no wheezes.  Musculoskeletal: Normal range of motion.  Neurological: She is alert and oriented to person, place, and time.  Skin: Skin is warm and dry.  Psychiatric: She has a normal mood and affect. Her behavior is normal.  Nursing note and vitals reviewed.   BP 120/70 (BP Location: Right Arm, Patient Position: Sitting, Cuff Size: Small)   Pulse (!) 105   Temp  98.1 F (36.7 C) (Oral)   Resp 18   Ht 5\' 5"  (1.651 m)   Wt 153 lb (69.4 kg)   SpO2 100%   BMI 25.46 kg/m     Assessment & Plan:   1. Attention deficit hyperactivity disorder (ADHD), combined type   Anita Osborne is a delightful 45 yo woman who has ran out of her adderall for a week and does have a rather impressive case of ADHD.  She only uses tid when working late so rxs last longer than 1 mo.  She got her alprazolam refilled by the provider at her office Therapist, sports).  She has f/u appt sched with Dr. Elisabeth Most for further adhd management.  RTC prn   Meds ordered this encounter  Medications  . amphetamine-dextroamphetamine (ADDERALL) 20 MG tablet    Sig: Take 1 tablet (20 mg total) by mouth 3 (three) times daily.    Dispense:  90 tablet    Refill:  0   Today I have utilized the [place] Controlled Substance Registry's online query to confirm compliance regarding the patient's narcotic pain medications. My review reveals appropriate prescription fills and that [facility] is the sole provider of these medications. Rechecks will occur regularly and the patient is aware of our use of the system.  I personally performed the services described in this documentation, which was scribed in my presence. The recorded information has been reviewed and considered, and addended by me as needed.   Anita Sorenson, M.D.  [facility] [address] [place], [place] [address] [phone] phone [phone] fax  01/12/16 8:44 AM

## 2016-01-12 NOTE — Patient Instructions (Signed)
     IF you received an x-ray today, you will receive an invoice from [facility]. Please contact [facility] at [phone] with questions or concerns regarding your invoice.   IF you received labwork today, you will receive an invoice from [facility]. Please contact [facility] at [phone] with questions or concerns regarding your invoice.   Our billing staff will not be able to assist you with questions regarding bills from these companies.  You will be contacted with the lab results as soon as they are available. The fastest way to get your results is to activate your My Chart account. Instructions are located on the last page of this paperwork. If you have not heard from us regarding the results in 2 weeks, please contact this office.      

## 2016-04-24 IMAGING — US US ABDOMEN COMPLETE
1 series · 14 of 25 positions shown · non-contrast
Comparison: No recent prior.

CLINICAL DATA: Nausea and vomiting.

EXAM:
ABDOMEN ULTRASOUND COMPLETE

[Series 1: us abdomen complete · 0.20mm/px · 14 of 79 slices shown]
[im 1/79]
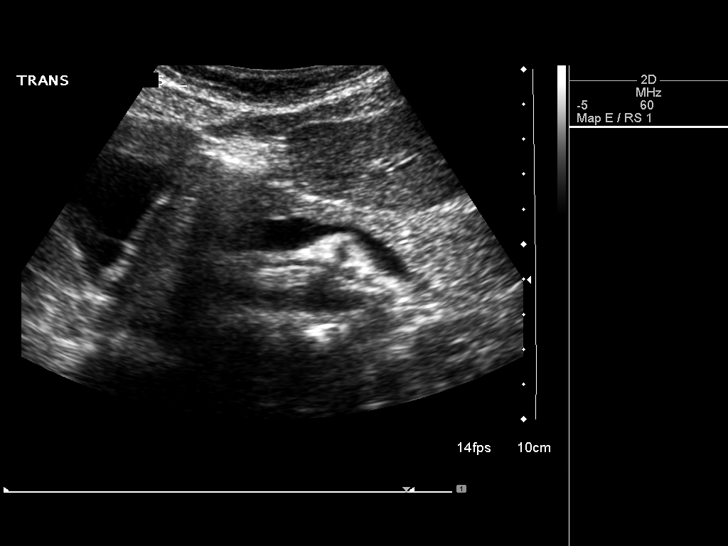
[im 7/79]
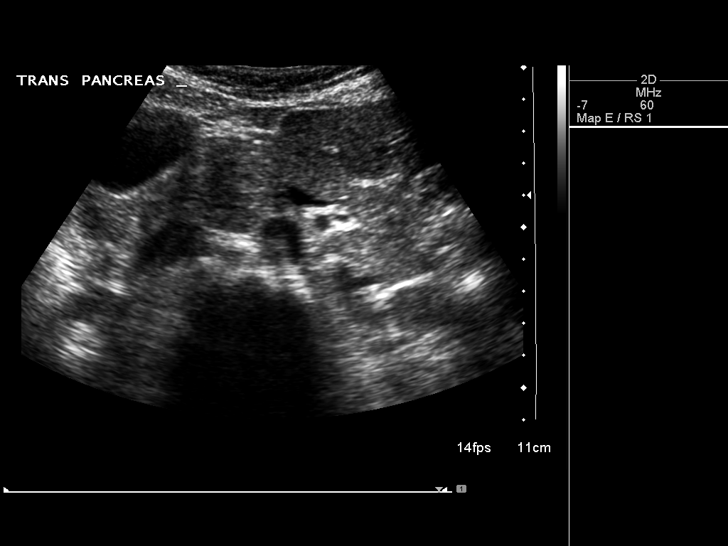
[im 14/79]
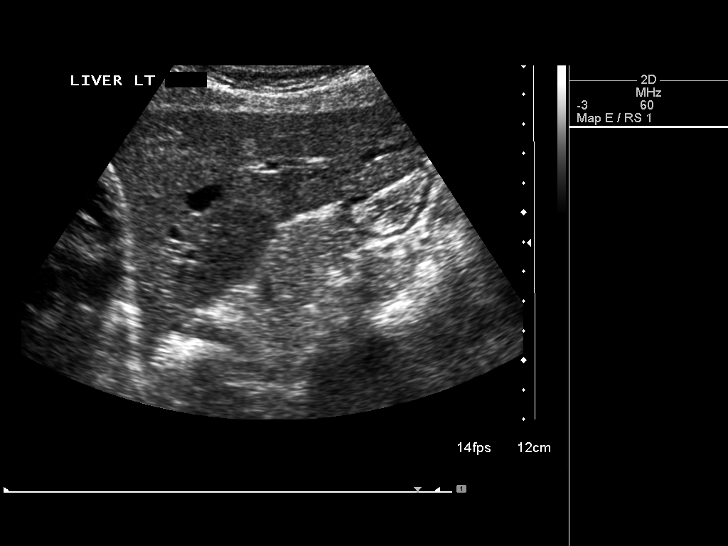
[im 20/79]
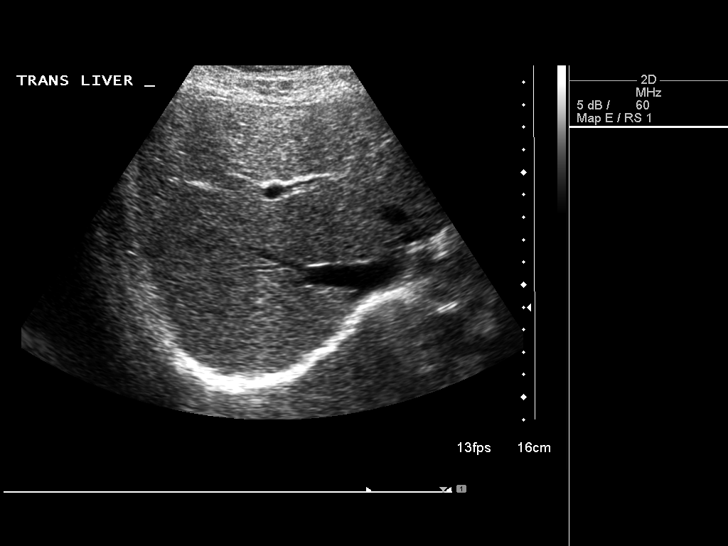
[im 27/79]
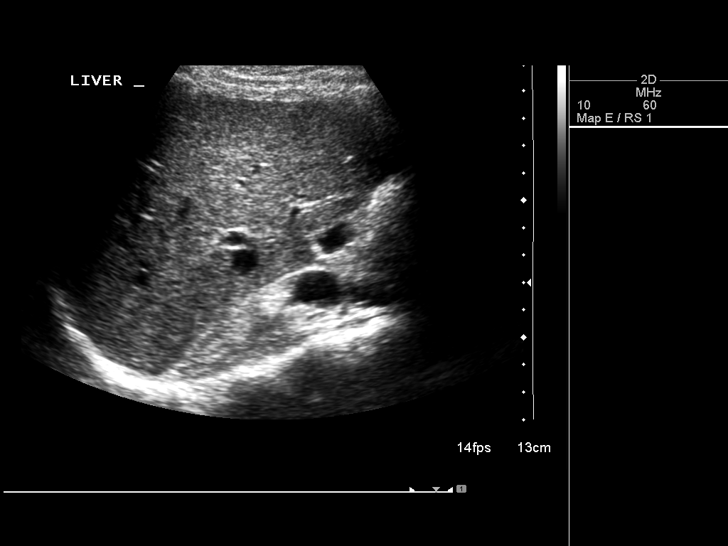
[im 30/79]
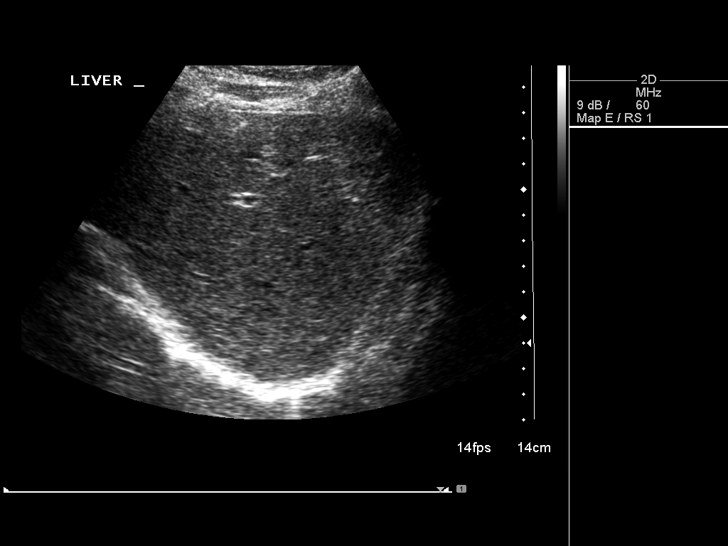
[im 36/79]
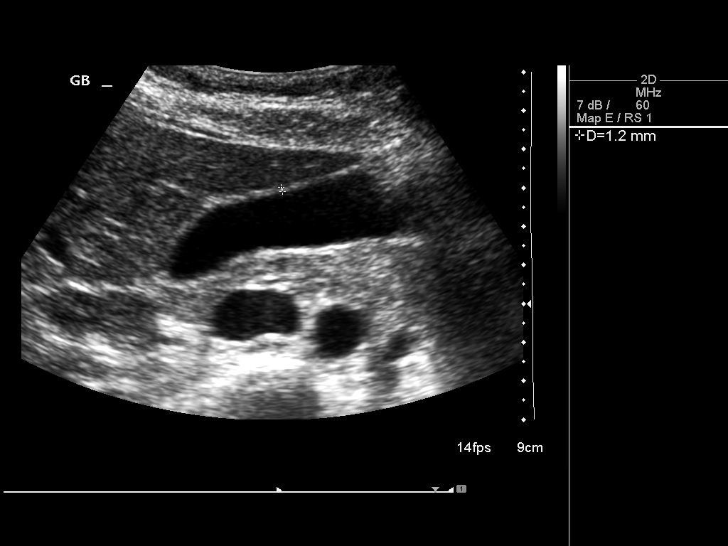
[im 43/79]
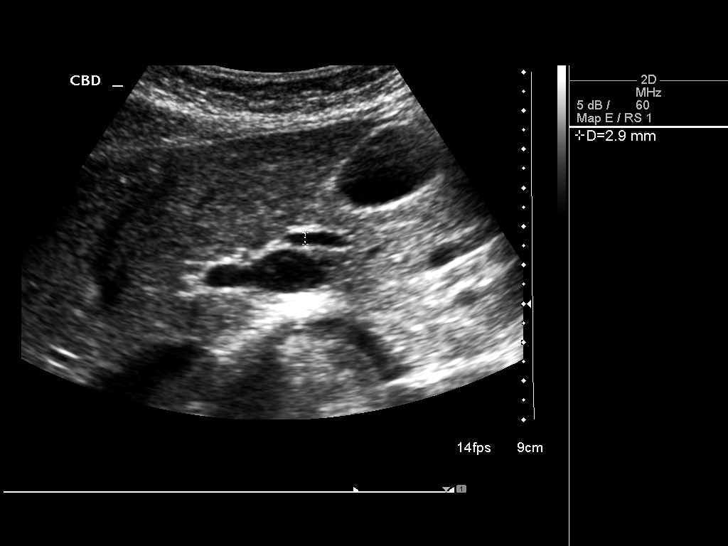
[im 49/79]
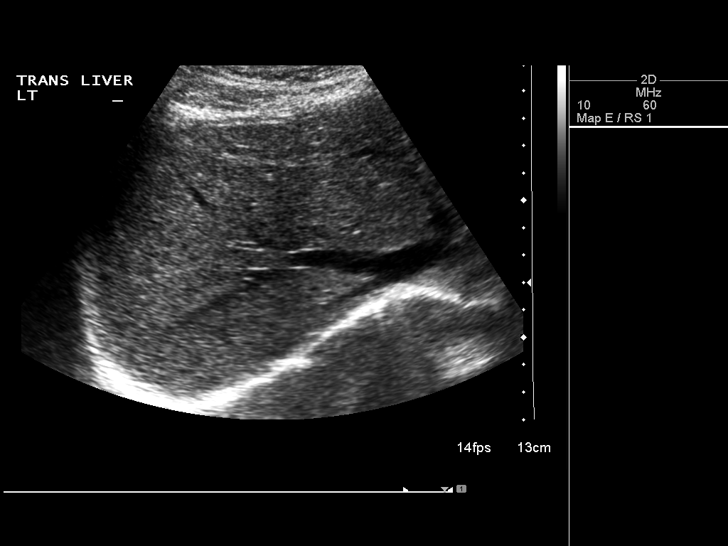
[im 53/79]
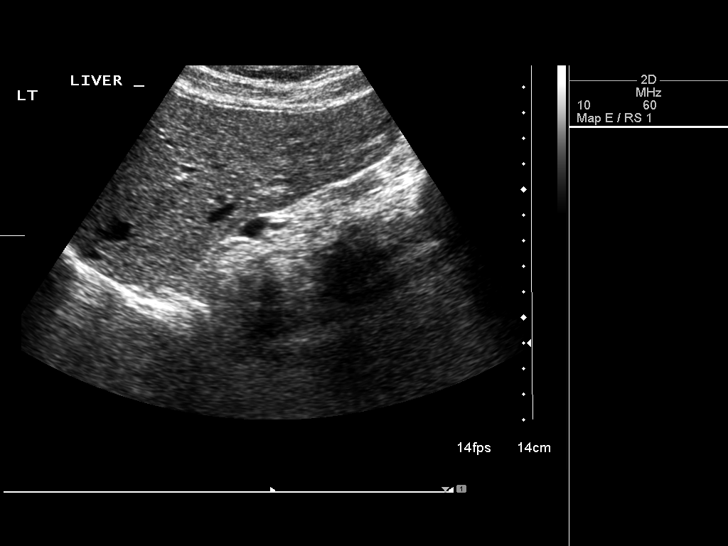
[im 59/79]
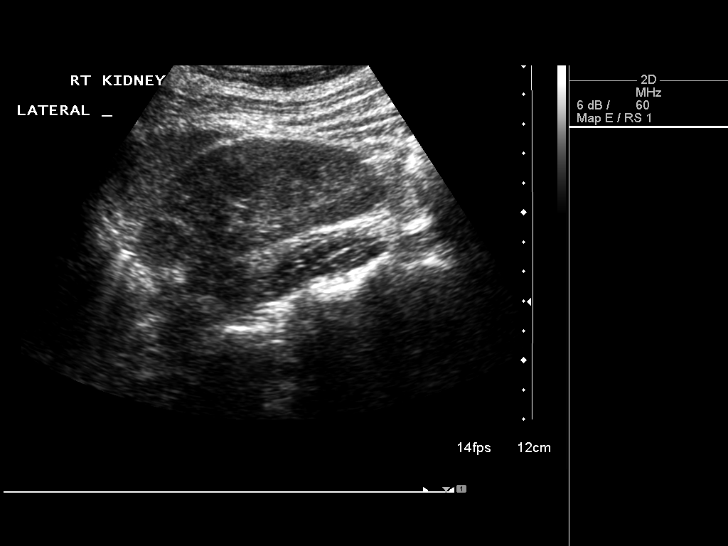
[im 66/79]
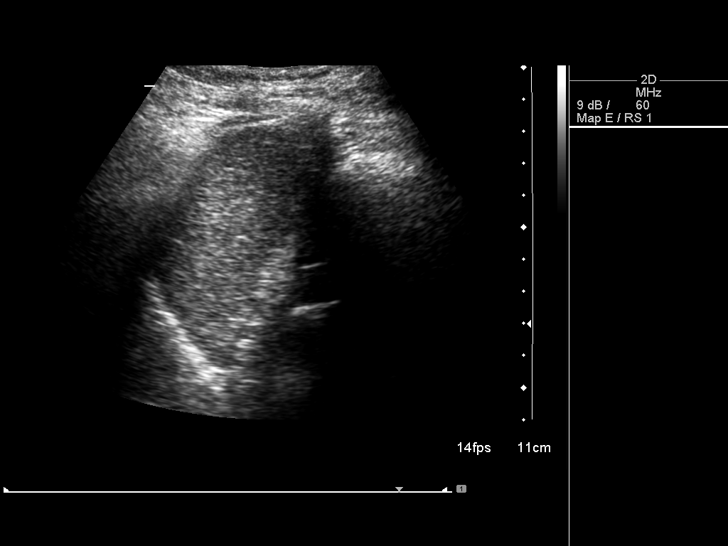
[im 72/79]
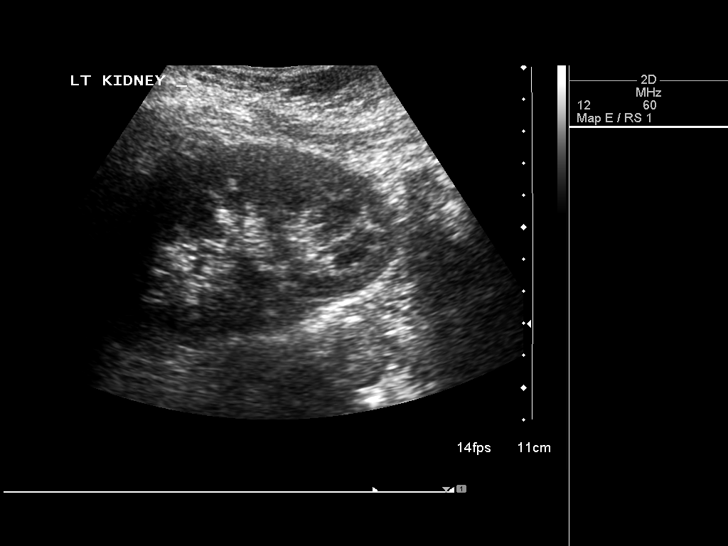
[im 79/79]
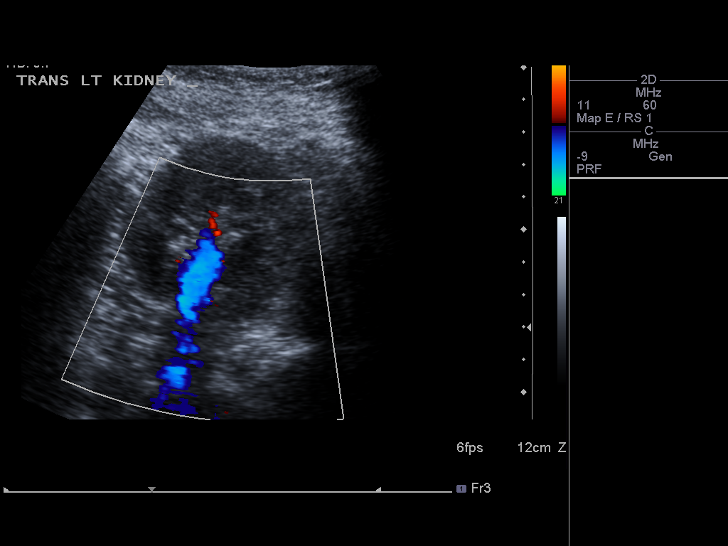

[14 of 25 positions shown; findings below may reference images not displayed]

FINDINGS: Gallbladder: No gallstones or wall thickening visualized. No
sonographic Murphy sign noted by sonographer.

Common bile duct: Diameter: 2.9 mm

Liver: No focal lesion identified. Within normal limits in
parenchymal echogenicity.

IVC: No abnormality visualized.

Pancreas: Visualized portion unremarkable.

Spleen: Size and appearance within normal limits.

Right Kidney: Length: 10.2 cm. Echogenicity within normal limits. No
mass or hydronephrosis visualized.

Left Kidney: Length: 10.8 cm. Echogenicity within normal limits. No
mass or hydronephrosis visualized.

Abdominal aorta: No aneurysm visualized.

Other findings: None.
IMPRESSION: No acute or focal abnormality.

## 2016-05-16 ENCOUNTER — Other Ambulatory Visit: Payer: Self-pay | Admitting: Physician Assistant

## 2019-04-01 DIAGNOSIS — Z9851 Tubal ligation status: Secondary | ICD-10-CM

## 2019-04-01 HISTORY — DX: Tubal ligation status: Z98.51

## 2019-12-08 ENCOUNTER — Emergency Department (INDEPENDENT_AMBULATORY_CARE_PROVIDER_SITE_OTHER)
Admission: RE | Admit: 2019-12-08 | Discharge: 2019-12-08 | Disposition: A | Discharge status: Home / Self Care | Payer: BC Managed Care – PPO | Source: Ambulatory Visit

## 2019-12-08 ENCOUNTER — Emergency Department (INDEPENDENT_AMBULATORY_CARE_PROVIDER_SITE_OTHER): Payer: BC Managed Care – PPO

## 2019-12-08 ENCOUNTER — Other Ambulatory Visit: Payer: Self-pay

## 2019-12-08 VITALS — BP 123/77 | HR 96 | Temp 98.6°F | Resp 16

## 2019-12-08 DIAGNOSIS — R102 Pelvic and perineal pain: Secondary | ICD-10-CM | POA: Diagnosis not present

## 2019-12-08 DIAGNOSIS — R300 Dysuria: Secondary | ICD-10-CM

## 2019-12-08 DIAGNOSIS — N83209 Unspecified ovarian cyst, unspecified side: Secondary | ICD-10-CM

## 2019-12-08 DIAGNOSIS — N3001 Acute cystitis with hematuria: Secondary | ICD-10-CM

## 2019-12-08 LAB — POCT URINALYSIS DIP (MANUAL ENTRY)
Glucose, UA: 100 mg/dL — AB
Leukocytes, UA: NEGATIVE
Nitrite, UA: POSITIVE — AB
Protein Ur, POC: 100 mg/dL — AB
Spec Grav, UA: >=1.03 — AB (ref 1.010–1.025)
Urobilinogen, UA: 2 E.U./dL — AB
pH, UA: 6 (ref 5.0–8.0)

## 2019-12-08 MED ORDER — CEPHALEXIN 500 MG PO CAPS: 500.0000 mg | ORAL_CAPSULE | Freq: Two times a day (BID) | ORAL | 0 refills | Status: DC

## 2019-12-08 MED ORDER — ACETAMINOPHEN 325 MG PO TABS
975.0000 mg | ORAL_TABLET | Freq: Once | ORAL | Status: AC
  Administered 2019-12-08: 975 mg via ORAL

## 2019-12-08 MED ORDER — HYDROCODONE-ACETAMINOPHEN 5-325 MG PO TABS: 1.0000 | ORAL_TABLET | Freq: Four times a day (QID) | ORAL | 0 refills | Status: DC | PRN

## 2019-12-08 NOTE — ED Provider Notes (Signed)
[facility]    CSN: [id number] Arrival date & time: 12/08/19  1141      History   Chief Complaint Chief Complaint  Patient presents with  . Appointment    1200  . Abdominal Pain    HPI Anita Osborne is a 49 y.o. female.   HPI  Anita Osborne is a 49 y.o. female presenting to UC with c/o lower abdominal pain that started yesterday.  Mild dysuria and bladder pressure sensation. She did an Evisit last night, macrobid was called into pharmacy but pharmacy had closed for the night. Pt in UC today due to sudden worsening of the lower abdominal pain, worse on Left side, worse than an UTI she has had in the past. Denies fever, chills, n/v/d. No back pain. No vaginal symptoms or concern for STIs. Hx of fibroids but never had pain like this in the past. She had a tubal ligation about 9 years ago, denies concern for pregnancy. LMP about 2 weeks ago.  Pt did take Azo this morning with mild relief.   Past Medical History:  Diagnosis Date  . Abnormal Pap smear 2008  . ADD (attention deficit disorder)   . Allergy   . Anxiety   . Asthma   . Depression   . Genital herpes   . H/O tubal ligation 2021  . Hyperlipidemia   . Migraine     Patient Active Problem List   Diagnosis Date Noted  . Right sacroiliac joint pain with radiation down the posterior thigh 09/20/2012  . Migraines 05/02/2012  . Depression with anxiety 12/25/2011  . ADHD (attention deficit hyperactivity disorder) 12/25/2011  . Asthma 08/16/2009    Past Surgical History:  Procedure Laterality Date  . EYE SURGERY    . TUBAL LIGATION  04/06/10    OB History    Gravida  3   Para  3   Term  3   Preterm      AB      Living  3     SAB      TAB      Ectopic      Multiple      Live Births  3            Home Medications    Prior to Admission medications   Medication Sig Start Date End Date Taking? Authorizing Provider  Amphet-Dextroamphet 3-Bead ER (MYDAYIS) 50 MG CP24 Take by  mouth.   Yes [provider]  sertraline (ZOLOFT) 50 MG tablet Take 50 mg by mouth daily.   Yes [provider]  ALPRAZolam (XANAX) 0.5 MG tablet Take 1 tablet (0.5 mg total) by mouth 2 (two) times daily. 05/23/15   Tonye Pearson, MD  amphetamine-dextroamphetamine (ADDERALL) 20 MG tablet Take 1 tablet (20 mg total) by mouth 3 (three) times daily. 01/12/16   Sherren Mocha, MD  buPROPion (WELLBUTRIN XL) 150 MG 24 hr tablet Take 1 tablet (150 mg total) by mouth 2 (two) times daily. 05/23/15   Tonye Pearson, MD  cephALEXin (KEFLEX) 500 MG capsule Take 1 capsule (500 mg total) by mouth 2 (two) times daily. 12/08/19   Lurene Shadow, PA-C  fexofenadine (ALLEGRA) 30 MG tablet Take 30 mg by mouth 2 (two) times daily.    [provider]  HYDROcodone-acetaminophen (NORCO/VICODIN) 5-325 MG tablet Take 1 tablet by mouth every 6 (six) hours as needed. 12/08/19   Lurene Shadow, PA-C  medroxyPROGESTERone (DEPO-PROVERA) 150 MG/ML injection Inject  1 mL (150 mg total) into the muscle every 3 (three) months. 05/23/15   Tonye Pearson, MD  ondansetron (ZOFRAN) 4 MG tablet Take 1 tab every 6 hours as needed for nausea. 06/04/15   Esterwood, Amy S, PA-C  pantoprazole (PROTONIX) 40 MG tablet Take 1 tab by mouth every morning. 06/04/15   Esterwood, Amy S, PA-C    Family History Family History  Problem Relation Age of Onset  . Heart disease Maternal Grandmother   . Diabetes type I Maternal Grandmother   . Hypertension Maternal Grandmother   . Heart disease Paternal Grandmother   . Asthma Brother   . Asthma Daughter   . Diabetes Mother   . Hypertension Father     Social History Social History   Tobacco Use  . Smoking status: Never Smoker  . Smokeless tobacco: Never Used  Substance Use Topics  . Alcohol use: Yes    Alcohol/week: 1.0 standard drink    Types: 1 Glasses of wine per week    Comment: occ  . Drug use: No     Allergies   Sulfonamide derivatives, Tramadol,  Codeine, and Percocet [oxycodone-acetaminophen]   Review of Systems Review of Systems  Gastrointestinal: Positive for abdominal pain. Negative for constipation, diarrhea, nausea and vomiting.  Genitourinary: Positive for dysuria, frequency, pelvic pain and urgency. Negative for decreased urine volume, flank pain, vaginal bleeding, vaginal discharge and vaginal pain.  Musculoskeletal: Negative for back pain.     Physical Exam Triage Vital Signs ED Triage Vitals  Enc Vitals Group     BP 12/08/19 1204 123/77     Pulse Rate 12/08/19 1204 96     Resp 12/08/19 1204 16     Temp 12/08/19 1204 98.6 F (37 C)     Temp Source 12/08/19 1204 Oral     SpO2 12/08/19 1204 100 %     Weight --      Height --      Head Circumference --      Peak Flow --      Pain Score 12/08/19 1200 5     Pain Loc --      Pain Edu? --      Excl. in GC? --    No data found.  Updated Vital Signs BP 123/77 (BP Location: Right Arm)   Pulse 96   Temp 98.6 F (37 C) (Oral)   Resp 16   SpO2 100%   Visual Acuity Right Eye Distance:   Left Eye Distance:   Bilateral Distance:    Right Eye Near:   Left Eye Near:    Bilateral Near:     Physical Exam Vitals and nursing note reviewed.  Constitutional:      Appearance: She is well-developed.  HENT:     Head: Normocephalic and atraumatic.  Cardiovascular:     Rate and Rhythm: Normal rate and regular rhythm.  Pulmonary:     Effort: Pulmonary effort is normal. No respiratory distress.     Breath sounds: Normal breath sounds.  Abdominal:     General: There is no distension.     Palpations: Abdomen is soft.     Tenderness: There is abdominal tenderness in the suprapubic area and left lower quadrant. There is guarding. There is no right CVA tenderness, left CVA tenderness or rebound. Negative signs include Murphy's sign and McBurney's sign.  Musculoskeletal:        General: Normal range of motion.     Cervical back: Normal range of motion.  Skin:     General: Skin is warm and dry.  Neurological:     Mental Status: She is alert and oriented to person, place, and time.  Psychiatric:        Behavior: Behavior normal.      UC Treatments / Results  Labs (all labs ordered are listed, but only abnormal results are displayed) Labs Reviewed  POCT URINALYSIS DIP (MANUAL ENTRY) - Abnormal; Notable for the following components:      Result Value   Color, UA orange (*)    Glucose, UA =100 (*)    Bilirubin, UA moderate (*)    Ketones, POC UA small (15) (*)    Spec Grav, UA >=1.030 (*)    Blood, UA trace-intact (*)    Protein Ur, POC =100 (*)    Urobilinogen, UA 2.0 (*)    Nitrite, UA Positive (*)    All other components within normal limits  URINE CULTURE    EKG   Radiology US PELVIC COMPLETE W TRANSVAGINAL AND TORSION R/O  Result Date: 12/08/2019 CLINICAL DATA:  Acute onset left pelvic pain 1 day ago. EXAM: TRANSABDOMINAL AND TRANSVAGINAL ULTRASOUND OF PELVIS DOPPLER ULTRASOUND OF OVARIES TECHNIQUE: Both transabdominal and transvaginal ultrasound examinations of the pelvis were performed. Transabdominal technique was performed for global imaging of the pelvis including uterus, ovaries, adnexal regions, and pelvic cul-de-sac. It was necessary to proceed with endovaginal exam following the transabdominal exam to visualize the adnexa. Color and duplex Doppler ultrasound was utilized to evaluate blood flow to the ovaries. COMPARISON:  None. FINDINGS: Uterus Measurements: 9.8 x 5.0 x 5.6 cm = volume: 144 mL. Three small fibroids off on the left and posteriorly measuring up to 2.1 cm. Endometrium Thickness: 0.8 cm.  No focal abnormality visualized. Right ovary Measurements: 3.8 x 1.4 x 2.7 cm = volume: 8 mL. Normal appearance/no adnexal mass. Left ovary Measurements: 4.7 x 2.9 x 3.1 cm = volume: There is 22 mL. There is a cystic lesion with lacy internal echoes measuring 3.0 x 2.4 x 2.9 cm most consistent with a small hemorrhagic cyst. Pulsed  Doppler evaluation of both ovaries demonstrates normal low-resistance arterial and venous waveforms. Other findings No abnormal free fluid. IMPRESSION: Small hemorrhagic left ovarian cyst. No followup is recommended. Note: This recommendation does not apply to premenarchal patients or to those with increased risk (genetic, family history, elevated tumor markers or other high-risk factors) of ovarian cancer. Reference: Radiology 2019 Nov; 293(2):359-371. Small uterine fibroids. Electronically Signed   By: Drusilla Kanner M.D.   On: 12/08/2019 13:40    Procedures Procedures (including critical care time)  Medications Ordered in UC Medications  acetaminophen (TYLENOL) tablet 975 mg (975 mg Oral Given 12/08/19 1245)    Initial Impression / Assessment and Plan / UC Course  I have reviewed the triage vital signs and the nursing notes.  Pertinent labs & imaging results that were available during my care of the patient were reviewed by me and considered in my medical decision making (see chart for details).     UA c/w UTI, however, pt did take Azo Culture sent Discussed imaging with pt Encouraged f/u with PCP and/or GYN Pt plans to schedule f/u with GYN due to need for well women exam including mammogram and pap smear.  Discussed symptoms that warrant emergent care in the ED. AVS given  Final Clinical Impressions(s) / UC Diagnoses   Final diagnoses:  Dysuria  Acute pelvic pain, female  Hemorrhagic ovarian cyst  Acute cystitis with  hematuria     Discharge Instructions     Please take your antibiotic as prescribed. A urine culture has been sent to check the severity of your urinary infection and to determine if you are on the most appropriate antibiotic. The results should come back within 2-3 days. You will only be notified if a medication change is indicated.  Please follow up with family medicine or gynecology next week if not improving or if symptoms improve but quickly come  back.  Norco/Vicodin (hydrocodone-acetaminophen) is a narcotic pain medication, do not combine these medications with others containing tylenol. While taking, do not drink alcohol, drive, or perform any other activities that requires focus while taking these medications.   You can also take ibuprofen 400-600mg  every 6-8 hours as needed for pain.   Call 911 or have someone drive you to the hospital if symptoms significantly worsening.       ED Prescriptions    Medication Sig Dispense Auth. Provider   cephALEXin (KEFLEX) 500 MG capsule Take 1 capsule (500 mg total) by mouth 2 (two) times daily. 14 capsule Waylan RocherPhelps, Leyton Magoon O, [place]   HYDROcodone-acetaminophen (NORCO/VICODIN) 5-325 MG tablet Take 1 tablet by mouth every 6 (six) hours as needed. 8 tablet Lurene ShadowPhelps, Kaoir Loree O, [place]-C     I have reviewed the PDMP during this encounter.   Lurene Shadowhelps, Nikky Duba O, [place] 12/08/19 1526

## 2019-12-08 NOTE — ED Triage Notes (Signed)
Patient presents to Urgent Care with complaints of lower abdominal pain since yesterday. Patient reports she feels like it is coming from her bladder, did a  Virtual visit and was called in some antibiotics, which she has not picked up yet because the pharmacy was closed last night. The pain is constant, denies unprotected intercourse.

## 2019-12-08 NOTE — Discharge Instructions (Signed)
Please take your antibiotic as prescribed. A urine culture has been sent to check the severity of your urinary infection and to determine if you are on the most appropriate antibiotic. The results should come back within 2-3 days. You will only be notified if a medication change is indicated.  Please follow up with family medicine or gynecology next week if not improving or if symptoms improve but quickly come back.  Norco/Vicodin (hydrocodone-acetaminophen) is a narcotic pain medication, do not combine these medications with others containing tylenol. While taking, do not drink alcohol, drive, or perform any other activities that requires focus while taking these medications.   You can also take ibuprofen 400-600mg  every 6-8 hours as needed for pain.   Call 911 or have someone drive you to the hospital if symptoms significantly worsening.

## 2019-12-09 LAB — URINE CULTURE
MICRO NUMBER:: 10929493
Result:: NO GROWTH
SPECIMEN QUALITY:: ADEQUATE

## 2020-07-05 ENCOUNTER — Encounter: Payer: BC Managed Care – PPO | Admitting: Obstetrics & Gynecology

## 2020-07-26 ENCOUNTER — Encounter: Payer: BC Managed Care – PPO | Admitting: Obstetrics & Gynecology

## 2020-07-26 NOTE — Progress Notes (Deleted)
GYNECOLOGY OFFICE VISIT NOTE  History:   Anita Osborne is a 50 y.o. [phone] here today to establish care and to discuss endometrial ablation for management of her AUB in the setting of known fibroids.  Was seen in the ED for pain in 11/2019, ultrasound showed 9 week sized uterus with left sided fibroids measuring up to 2 cm.    She denies any abnormal vaginal discharge, bleeding, pelvic pain or other concerns.    Past Medical History:  Diagnosis Date  . Abnormal Pap smear 2008  . ADD (attention deficit disorder)   . Allergy   . Anxiety   . Asthma   . Depression   . Genital herpes   . H/O tubal ligation 2021  . Hyperlipidemia   . Migraine     Past Surgical History:  Procedure Laterality Date  . EYE SURGERY    . TUBAL LIGATION  04/06/10    The following portions of the patient's history were reviewed and updated as appropriate: allergies, current medications, past family history, past medical history, past social history, past surgical history and problem list.   Health Maintenance:  Normal pap and negative HRHPV on ***.  Normal mammogram on ***.   Review of Systems:  Pertinent items noted in HPI and remainder of comprehensive ROS otherwise negative.  Physical Exam:  There were no vitals taken for this visit. CONSTITUTIONAL: Well-developed, well-nourished female in no acute distress.  HEENT:  Normocephalic, atraumatic. External right and left ear normal. No scleral icterus.  NECK: Normal range of motion, supple, no masses noted on observation SKIN: No rash noted. Not diaphoretic. No erythema. No pallor. MUSCULOSKELETAL: Normal range of motion. No edema noted. NEUROLOGIC: Alert and oriented to person, place, and time. Normal muscle tone coordination. No cranial nerve deficit noted. PSYCHIATRIC: Normal mood and affect. Normal behavior. Normal judgment and thought content. CARDIOVASCULAR: Normal heart rate noted RESPIRATORY: Effort and breath sounds normal, no problems  with respiration noted ABDOMEN: No masses noted. No other overt distention noted.   PELVIC: {Blank single:19197::"Deferred","Normal appearing external genitalia; normal urethral meatus; normal appearing vaginal mucosa and cervix.  No abnormal discharge noted.  Normal uterine size, no other palpable masses, no uterine or adnexal tenderness. Performed in the presence of a chaperone"}  Labs and Imaging 12/08/2019 TRANSABDOMINAL AND TRANSVAGINAL ULTRASOUND OF PELVIS DOPPLER ULTRASOUND OF OVARIES CLINICAL DATA:  Acute onset left pelvic pain 1 day ago. TECHNIQUE: Both transabdominal and transvaginal ultrasound examinations of the pelvis were performed. Transabdominal technique was performed for global imaging of the pelvis including uterus, ovaries, adnexal regions, and pelvic cul-de-sac. It was necessary to proceed with endovaginal exam following the transabdominal exam to visualize the adnexa. Color and duplex Doppler ultrasound was utilized to evaluate blood flow to the ovaries. COMPARISON:  None. FINDINGS: Uterus Measurements: 9.8 x 5.0 x 5.6 cm = volume: 144 mL. Three small fibroids off on the left and posteriorly measuring up to 2.1 cm. Endometrium Thickness: 0.8 cm.  No focal abnormality visualized. Right ovary Measurements: 3.8 x 1.4 x 2.7 cm = volume: 8 mL. Normal appearance/no adnexal mass. Left ovary Measurements: 4.7 x 2.9 x 3.1 cm = volume: There is 22 mL. There is a cystic lesion with lacy internal echoes measuring 3.0 x 2.4 x 2.9 cm most consistent with a small hemorrhagic cyst. Pulsed Doppler evaluation of both ovaries demonstrates normal low-resistance arterial and venous waveforms. Other findings No abnormal free fluid. IMPRESSION: Small hemorrhagic left ovarian cyst. No followup is recommended. Note: This recommendation  does not apply to premenarchal patients or to those with increased risk (genetic, family history, elevated tumor markers or other  high-risk factors) of ovarian cancer. Reference: Radiology 2019 Nov; 293(2):359-371. Small uterine fibroids.  Electronically Signed   By: Drusilla Kanner M.D.   On: 12/08/2019 13:40     Assessment and Plan:    1. Abnormal uterine bleeding (AUB) 2. Fibroids ***Needs pap smear ***Needs endometrial biopsy  Recommended Hydrothermal Endometrial ablation, procedure was discussed in detail.  Risks of surgery were discussed with the patient including but not limited to: bleeding; infection which may require antibiotics; injury to uterus leading to risk of injury to surrounding intraperitoneal organs, burn injury to vagina or other organs, need for additional procedures including laparoscopy or laparotomy, inability to complete ablation due to uterine or mechanical anomaly, need for trial of another ablation modality, and other postoperative/anesthesia complications.  Patient was told that the likelihood that her condition and symptoms will be treated effectively with this surgical management was very high; the postoperative expectations were also discussed in detail.  Patient was told that it is normal to have menstrual bleeding after an endometrial ablation, only about 50% of patients become amenorrheic, 40% of patients have normal or light periods, and 5-10% of patients have no change in their bleeding pattern and may need further intervention. Patient also verified she is done with childbearing as this procedure is contraindicated for patient desiring future fertility; uses tubal ligation for contraception. The patient also understands the alternative treatment options which were discussed in full. All questions were answered.  Patient will call her insurance company to determine costs, will also determine if this is cheaper to be done in office setting (she will need referral to outside office if this is the case). She will let us know if she wants [place] to proceed with scheduling through our surgical  centers here at [facility].   Printed patient education handouts about the procedure were given to the patient to review at home.      Routine preventative health maintenance measures emphasized. Please refer to After Visit Summary for other counseling recommendations.   No follow-ups on file.    I spent {Blank single:19197::"10","15","20","25","30"} minutes dedicated to the care of this patient including pre-visit review of records, face to face time with the patient discussing her conditions and treatments and post visit ordering of testing.    Jaynie Collins, MD, FACOG Obstetrician & Gynecologist, [facility] for [employer], [facility], [facility]
